# Patient Record
Sex: Male | Born: 1955
Health system: Southern US, Community
[De-identification: ages and names within clinical notes are randomized; demographics above are authoritative.]

## PROBLEM LIST (undated history)

## (undated) DIAGNOSIS — M1990 Unspecified osteoarthritis, unspecified site: Secondary | ICD-10-CM

## (undated) DIAGNOSIS — F4323 Adjustment disorder with mixed anxiety and depressed mood: Secondary | ICD-10-CM

## (undated) DIAGNOSIS — I10 Essential (primary) hypertension: Secondary | ICD-10-CM

## (undated) DIAGNOSIS — J45909 Unspecified asthma, uncomplicated: Secondary | ICD-10-CM

## (undated) DIAGNOSIS — E785 Hyperlipidemia, unspecified: Secondary | ICD-10-CM

## (undated) HISTORY — DX: Unspecified osteoarthritis, unspecified site: M19.90

## (undated) HISTORY — DX: Essential (primary) hypertension: I10

## (undated) HISTORY — DX: Adjustment disorder with mixed anxiety and depressed mood: F43.23

## (undated) HISTORY — DX: Unspecified asthma, uncomplicated: J45.909

## (undated) HISTORY — PX: DENTAL SURGERY: SHX609

## (undated) HISTORY — DX: Hyperlipidemia, unspecified: E78.5

---

## 2006-04-29 ENCOUNTER — Ambulatory Visit: Payer: Self-pay | Admitting: Orthopedic Surgery

## 2006-05-07 ENCOUNTER — Ambulatory Visit (HOSPITAL_COMMUNITY): Admission: RE | Admit: 2006-05-07 | Discharge: 2006-05-07 | Payer: Self-pay | Admitting: Orthopedic Surgery

## 2006-05-13 ENCOUNTER — Ambulatory Visit: Payer: Self-pay | Admitting: Orthopedic Surgery

## 2008-08-21 ENCOUNTER — Ambulatory Visit (HOSPITAL_COMMUNITY): Admission: RE | Admit: 2008-08-21 | Discharge: 2008-08-21 | Payer: Self-pay | Admitting: General Surgery

## 2010-06-06 NOTE — H&P (Signed)
Ryan Cain, Ryan Cain                ACCOUNT NO.:  [id number]   MEDICAL RECORD NO.:  [id number]          PATIENT TYPE:  AMB   LOCATION:  DAY                           FACILITY:  [facility]   PHYSICIAN:  Dalia Heading, M.D.  DATE OF BIRTH:  1955-07-21   DATE OF ADMISSION:  DATE OF DISCHARGE:  LH                              HISTORY & PHYSICAL   CHIEF COMPLAINT:  Family history of colon carcinoma.   HISTORY OF PRESENT ILLNESS:  The patient is a 55 year old black male,  who is referred for endoscopic evaluation.  Needs colonoscopy for  screening purposes.  No abdominal pain, weight loss, nausea, vomiting,  diarrhea, constipation, melena, or hematochezia have been noted.  He has  never had a colonoscopy.  His mother had colon cancer.   PAST MEDICAL HISTORY:  Includes hypertension.   PAST SURGICAL HISTORY:  Unremarkable.   CURRENT MEDICATIONS:  Lipitor, ramipril, Ziac.   ALLERGIES:  No known drug allergies.   REVIEW OF SYSTEMS:  Noncontributory.   PHYSICAL EXAMINATION:  GENERAL:  The patient is a well-developed, well-  nourished, black male, in no acute distress.  LUNGS:  Clear to auscultation with equal breath sounds bilaterally.  HEART:  Regular rate and rhythm without S3, S4, or murmurs.  ABDOMEN:  Soft, nontender, nondistended.  No hepatosplenomegaly or  masses noted.  RECTAL:  Deferred to the procedure.   IMPRESSION:  Family history of colon cancer.   PLAN:  The patient is scheduled for a colonoscopy on August 21, 2008.  The risks and benefits of the procedure including bleeding and  perforation were fully explained to the patient, gave informed consent.      Dalia Heading, M.D.  Electronically Signed     MAJ/MEDQ  D:  07/26/2008  T:  07/27/2008  Job:  [id number]   cc:   Patrica Duel, M.D.  Fax: [phone]

## 2011-12-25 ENCOUNTER — Ambulatory Visit (INDEPENDENT_AMBULATORY_CARE_PROVIDER_SITE_OTHER): Payer: BC Managed Care – PPO | Admitting: Urology

## 2011-12-25 DIAGNOSIS — R3129 Other microscopic hematuria: Secondary | ICD-10-CM

## 2015-08-08 LAB — BASIC METABOLIC PANEL
BUN: 11 mg/dL (ref 4–21)
Creatinine: 1.1 mg/dL (ref <=1.3)
GLUCOSE: 90 mg/dL
Potassium: 4 mmol/L (ref 3.4–5.3)
SODIUM: 143 mmol/L (ref 137–147)

## 2015-08-08 LAB — TSH: TSH: 1.71 u[IU]/mL (ref <=5.90)

## 2015-08-08 LAB — LIPID PANEL
Cholesterol: 210 mg/dL — AB (ref 0–200)
HDL: 53 mg/dL (ref 35–70)
LDL CALC: 131 mg/dL
Triglycerides: 130 mg/dL (ref 40–160)

## 2015-08-08 LAB — CBC AND DIFFERENTIAL
HEMATOCRIT: 38 % — AB (ref 41–53)
HEMOGLOBIN: 13.5 g/dL (ref 13.5–17.5)
Platelets: 202 10*3/uL (ref 150–399)
WBC: 6.1 10*3/mL

## 2015-08-08 LAB — HEPATIC FUNCTION PANEL
ALT: 15 U/L (ref 10–40)
AST: 16 U/L (ref 14–40)
Alkaline Phosphatase: 60 U/L (ref 25–125)
Bilirubin, Total: 0.4 mg/dL

## 2015-11-05 ENCOUNTER — Encounter: Payer: Self-pay | Admitting: Family Medicine

## 2015-11-05 ENCOUNTER — Ambulatory Visit (INDEPENDENT_AMBULATORY_CARE_PROVIDER_SITE_OTHER): Payer: PRIVATE HEALTH INSURANCE | Admitting: Family Medicine

## 2015-11-05 VITALS — BP 136/78 | HR 76 | Temp 98.7°F | Resp 18 | Ht 72.5 in | Wt 267.0 lb

## 2015-11-05 DIAGNOSIS — I10 Essential (primary) hypertension: Secondary | ICD-10-CM | POA: Insufficient documentation

## 2015-11-05 DIAGNOSIS — Z7689 Persons encountering health services in other specified circumstances: Secondary | ICD-10-CM | POA: Diagnosis not present

## 2015-11-05 DIAGNOSIS — E785 Hyperlipidemia, unspecified: Secondary | ICD-10-CM | POA: Diagnosis not present

## 2015-11-05 DIAGNOSIS — E669 Obesity, unspecified: Secondary | ICD-10-CM | POA: Insufficient documentation

## 2015-11-05 DIAGNOSIS — Z23 Encounter for immunization: Secondary | ICD-10-CM | POA: Diagnosis not present

## 2015-11-05 DIAGNOSIS — M170 Bilateral primary osteoarthritis of knee: Secondary | ICD-10-CM | POA: Insufficient documentation

## 2015-11-05 DIAGNOSIS — F4323 Adjustment disorder with mixed anxiety and depressed mood: Secondary | ICD-10-CM

## 2015-11-05 DIAGNOSIS — IMO0001 Reserved for inherently not codable concepts without codable children: Secondary | ICD-10-CM

## 2015-11-05 DIAGNOSIS — E6609 Other obesity due to excess calories: Secondary | ICD-10-CM

## 2015-11-05 DIAGNOSIS — Z6835 Body mass index (BMI) 35.0-35.9, adult: Secondary | ICD-10-CM

## 2015-11-05 MED ORDER — MELOXICAM 15 MG PO TABS: 15.0000 mg | ORAL_TABLET | Freq: Every day | ORAL | 0 refills | Status: DC

## 2015-11-05 NOTE — Patient Instructions (Addendum)
Need old records  Take the meloxicam once a day This is in place of the ibuprofen If you have pain in addition then take an arthritis tylenol  Continue same medicine Call for problems or refills  See me in January for follow up

## 2015-11-05 NOTE — Progress Notes (Signed)
Chief Complaint  Patient presents with  . Establish Care   Here for first visit new to establish. Hypertension many years well controlled per patient with no cardiovascular disease. On lipitor with recent labs drawn by prior PCP.  Will request records. Had colonoscopy at age 60, so is due.   No recent immunizations.  Will do flu and TdaP today and shingles next visit. 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  He has had some anxiety about this and will have a lapse in insurance.  He wishes to have any additional health testing deferred until jan.  He was prescribed xanax.  We discussed that this is a good short term medicine, but NOT long term fix. He has significant arthritis in his knees.  They swell and hurt.  He takes ibuprofen frequently.  Is offered Mobic for daily use instead. He drinks alcohol to excess at times.  He says recently with his unemployment he has had up to a pint a day.  We agree that hw will make a large effort to drink no more than 1-2 drinks daily.    Patient Active Problem List   Diagnosis Date Noted  . Essential hypertension 11/05/2015  . Osteoarthritis of both knees 11/05/2015  . HLD (hyperlipidemia) 11/05/2015  . Situational mixed anxiety and depressive disorder 11/05/2015  . Obesity 11/05/2015    Outpatient Encounter Prescriptions as of 11/05/2015  Medication Sig  . ALPRAZolam (XANAX) 0.5 MG tablet Take 0.5 mg by mouth 2 (two) times daily as needed for anxiety.  Marland Kitchen atorvastatin (LIPITOR) 20 MG tablet Take 20 mg by mouth daily.  . Ginkgo Biloba 120 MG CAPS Take by mouth.  Marland Kitchen ibuprofen (ADVIL,MOTRIN) 200 MG tablet Take 200 mg by mouth every 8 (eight) hours as needed.  Marland Kitchen losartan-hydrochlorothiazide (HYZAAR) 100-25 MG tablet Take 1 tablet by mouth daily.  . Omega-3 Fatty Acids (FISH OIL) 1200 MG CAPS Take 2,400 mg by mouth.  . meloxicam (MOBIC) 15 MG tablet Take 1 tablet (15 mg total) by mouth daily.   No  facility-administered encounter medications on file as of 11/05/2015.     Past Medical History:  Diagnosis Date  . Arthritis   . Asthma    as a child  . Hyperlipidemia   . Hypertension     Past Surgical History:  Procedure Laterality Date  . DENTAL SURGERY      Social History   Social History  . Marital status: Married    Spouse name: Altha Harm  . Number of children: 1  . Years of education: 42   Occupational History  . 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 Smoking status: Never Smoker  . Smokeless tobacco: Never Used  . Alcohol use 3.6 oz/week    6 Shots of liquor per week     Comment: 3x a week bourbon  . Drug use: No  . Sexual activity: Yes    Birth control/ protection: Post-menopausal   Other Topics Concern  . 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 Arthritis Mother   . Hypertension Mother   . Hyperlipidemia Mother   . Heart disease Father 41    heart attack  . Heart disease Paternal Uncle  Review of Systems  Constitutional: Negative for chills, fever and weight loss.  HENT: Negative for congestion and hearing loss.   Eyes: Negative for blurred vision and pain.  Respiratory: Negative for cough and shortness of breath.   Cardiovascular: Negative for chest pain, palpitations and leg swelling.  Gastrointestinal: Negative for abdominal pain, constipation, diarrhea and heartburn.  Genitourinary: Negative for dysuria and frequency.  Musculoskeletal: Positive for joint pain. Negative for falls and myalgias.  Neurological: Negative for dizziness, seizures and headaches.  Psychiatric/Behavioral: Negative for depression. The patient is nervous/anxious. The patient does not have insomnia.     BP 136/78 (BP Location: Right Arm, Patient Position: Sitting, Cuff Size:  Large)   Pulse 76   Temp 98.7 F (37.1 C) (Oral)   Resp 18   Ht 6' 0.5" (1.842 m)   Wt 267 lb (121.1 kg)   SpO2 100%   BMI 35.71 kg/m   Physical Exam  Constitutional: He is oriented to person, place, and time. He appears well-developed and well-nourished.  overweight  HENT:  Head: Normocephalic and atraumatic.  Right Ear: External ear normal.  Left Ear: External ear normal.  Mouth/Throat: Oropharynx is clear and moist.  Eyes: Conjunctivae are normal. Pupils are equal, round, and reactive to light.  Neck: Normal range of motion. Neck supple. No thyromegaly present.  Cardiovascular: Normal rate, regular rhythm and normal heart sounds.   Pulmonary/Chest: Effort normal and breath sounds normal. No respiratory distress.  Abdominal: Soft. Bowel sounds are normal.  Musculoskeletal: Normal range of motion. He exhibits edema.  Both knees with crepitus and warmth, slight effusion  Lymphadenopathy:    He has no cervical adenopathy.  Neurological: He is alert and oriented to person, place, and time.  Gait normal  Skin: Skin is warm and dry.  Psychiatric: He has a normal mood and affect. His behavior is normal. Thought content normal.  Nursing note and vitals reviewed.  ASSESSMENT/PLAN:   1. Essential hypertension Well controlled  2. Primary osteoarthritis of both knees Add mobic.  Will refer ortho when insurance allows  3. Hyperlipidemia, unspecified hyperlipidemia type Get old records  4. Situational mixed anxiety and depressive disorder Xanax prn.  Limit alcohol  5. Need for Tdap vaccination - Tdap vaccine greater than or equal to 7yo IM  6. Need for influenza vaccination - Flu Vaccine QUAD 36+ mos PF IM (Fluarix & Fluzone Quad PF)  7. Class 2 obesity due to excess calories with serious comorbidity and body mass index (BMI) of 35.0 to 35.9 in adult  8. Encounter to establish care with new doctor  Patient Instructions  Need old records  Take the meloxicam once a  day This is in place of the ibuprofen If you have pain in addition then take an arthritis tylenol  Continue same medicine Call for problems or refills  See me in January for follow up    Raylene Everts, MD

## 2015-11-06 ENCOUNTER — Encounter: Payer: Self-pay | Admitting: Family Medicine

## 2016-01-16 ENCOUNTER — Other Ambulatory Visit: Payer: Self-pay | Admitting: Family Medicine

## 2016-01-24 ENCOUNTER — Other Ambulatory Visit: Payer: Self-pay | Admitting: Family Medicine

## 2016-01-24 NOTE — Telephone Encounter (Signed)
Seen 10 17 17

## 2016-02-04 ENCOUNTER — Encounter: Payer: Self-pay | Admitting: Family Medicine

## 2016-02-04 ENCOUNTER — Ambulatory Visit (INDEPENDENT_AMBULATORY_CARE_PROVIDER_SITE_OTHER): Payer: BLUE CROSS/BLUE SHIELD | Admitting: Family Medicine

## 2016-02-04 VITALS — BP 118/78 | HR 72 | Temp 98.4°F | Resp 18 | Ht 72.5 in | Wt 264.0 lb

## 2016-02-04 DIAGNOSIS — M170 Bilateral primary osteoarthritis of knee: Secondary | ICD-10-CM | POA: Diagnosis not present

## 2016-02-04 DIAGNOSIS — I10 Essential (primary) hypertension: Secondary | ICD-10-CM | POA: Diagnosis not present

## 2016-02-04 NOTE — Patient Instructions (Signed)
See me in six months Call sooner for problems 

## 2016-02-04 NOTE — Progress Notes (Signed)
    Chief Complaint  Patient presents with  . Follow-up   No complaints Knees better with NSAID No GERD or heartburn BP well controlled Wants to delay colonoscopy for a few months, starting new job  Patient Active Problem List   Diagnosis Date Noted  . Essential hypertension 11/05/2015  . Osteoarthritis of both knees 11/05/2015  . HLD (hyperlipidemia) 11/05/2015  . Situational mixed anxiety and depressive disorder 11/05/2015  . Obesity 11/05/2015    Outpatient Encounter Prescriptions as of 02/04/2016  Medication Sig  . atorvastatin (LIPITOR) 20 MG tablet Take 20 mg by mouth daily.  . Ginkgo Biloba 120 MG CAPS Take by mouth.  Marland Kitchen ibuprofen (ADVIL,MOTRIN) 200 MG tablet Take 200 mg by mouth every 8 (eight) hours as needed.  Marland Kitchen losartan-hydrochlorothiazide (HYZAAR) 100-25 MG tablet Take 1 tablet by mouth daily.  . meloxicam (MOBIC) 15 MG tablet Take 1 tablet (15 mg total) by mouth daily. TAKE WITH FOOD  . Omega-3 Fatty Acids (FISH OIL) 1200 MG CAPS Take 2,400 mg by mouth.  . sildenafil (VIAGRA) 100 MG tablet Take 100 mg by mouth daily as needed for erectile dysfunction.   No facility-administered encounter medications on file as of 02/04/2016.     No Known Allergies  Review of Systems  Constitutional: Negative for activity change, appetite change and unexpected weight change.  HENT: Negative for congestion.   Eyes: Negative for photophobia and visual disturbance.  Respiratory: Negative for cough and shortness of breath.   Cardiovascular: Negative for chest pain, palpitations and leg swelling.  Gastrointestinal: Negative for abdominal distention and abdominal pain.  Genitourinary: Negative.  Negative for difficulty urinating.  Musculoskeletal: Positive for arthralgias.  Neurological: Negative for dizziness and headaches.  Psychiatric/Behavioral: Negative for dysphoric mood. The patient is not nervous/anxious.     BP 118/78 (BP Location: Right Arm, Patient Position: Sitting,  Cuff Size: Large)   Pulse 72   Temp 98.4 F (36.9 C) (Oral)   Resp 18   Ht 6' 0.5" (1.842 m)   Wt 264 lb (119.7 kg)   SpO2 100%   BMI 35.31 kg/m   Physical Exam  Constitutional: He is oriented to person, place, and time. He appears well-developed and well-nourished.  HENT:  Head: Normocephalic and atraumatic.  Mouth/Throat: Oropharynx is clear and moist.  Eyes: Conjunctivae are normal. Pupils are equal, round, and reactive to light.  Neck: Normal range of motion. Neck supple. No thyromegaly present.  Cardiovascular: Normal rate, regular rhythm and normal heart sounds.   Pulmonary/Chest: Effort normal and breath sounds normal. No respiratory distress.  Musculoskeletal: Normal range of motion. He exhibits no edema.  Lymphadenopathy:    He has no cervical adenopathy.  Neurological: He is alert and oriented to person, place, and time.  Gait normal  Skin: Skin is warm and dry.  Psychiatric: He has a normal mood and affect. His behavior is normal. Thought content normal.  Nursing note and vitals reviewed.   ASSESSMENT/PLAN:  1. Essential hypertension controlled  2. Primary osteoarthritis of both knees improved  Patient Instructions  See me in six months  Call sooner for problems   Raylene Everts, MD

## 2016-02-25 ENCOUNTER — Telehealth: Payer: Self-pay

## 2016-02-25 ENCOUNTER — Encounter: Payer: Self-pay | Admitting: Family Medicine

## 2016-02-25 ENCOUNTER — Ambulatory Visit (HOSPITAL_COMMUNITY)
Admission: RE | Admit: 2016-02-25 | Discharge: 2016-02-25 | Disposition: A | Discharge status: Home / Self Care | Payer: BLUE CROSS/BLUE SHIELD | Source: Ambulatory Visit | Attending: Family Medicine | Admitting: Family Medicine

## 2016-02-25 ENCOUNTER — Ambulatory Visit (INDEPENDENT_AMBULATORY_CARE_PROVIDER_SITE_OTHER): Payer: BLUE CROSS/BLUE SHIELD | Admitting: Family Medicine

## 2016-02-25 VITALS — BP 118/74 | HR 80 | Temp 97.5°F | Resp 18 | Ht 72.5 in | Wt 265.0 lb

## 2016-02-25 DIAGNOSIS — M5431 Sciatica, right side: Secondary | ICD-10-CM | POA: Diagnosis not present

## 2016-02-25 DIAGNOSIS — M2578 Osteophyte, vertebrae: Secondary | ICD-10-CM | POA: Insufficient documentation

## 2016-02-25 DIAGNOSIS — I700 Atherosclerosis of aorta: Secondary | ICD-10-CM | POA: Insufficient documentation

## 2016-02-25 DIAGNOSIS — M5136 Other intervertebral disc degeneration, lumbar region: Secondary | ICD-10-CM | POA: Diagnosis not present

## 2016-02-25 MED ORDER — GABAPENTIN 100 MG PO CAPS: 100.0000 mg | ORAL_CAPSULE | Freq: Three times a day (TID) | ORAL | 3 refills | Status: DC

## 2016-02-25 NOTE — Telephone Encounter (Signed)
Left message with pts wife to call office.

## 2016-02-25 NOTE — Telephone Encounter (Signed)
-----   Message from Raylene Everts, MD sent at 02/25/2016  1:46 PM EST ----- Call Mr Alzate.  He works second shift.  His x ray shows some arthritis and bone spurs, nothing seroius.  No specific problem at his lower back/hip bone area on the left.

## 2016-02-25 NOTE — Patient Instructions (Signed)
Take the meloxicam daily Take the gabapentin for the nerve pain Start with one pill at bedtime May take daytime if does not cause drowsiness  Get the back x ray We will call you with the result  See me if not better in a couple of weeks Call if you desire physical therapy.  I think it would help

## 2016-02-25 NOTE — Progress Notes (Signed)
Chief Complaint  Patient presents with  . Back Pain   Here for back pain Says he has had back pain intermittently for years Currently has pain in the right low back and it radiates down back of leg to knee He is on a new job and has worked 12 days in a row. He stands for almost all of his shift, and he feels more pain and fatigue as the shift wears on.  No numbness or weakness.  No fall or injury.  No illness or fever.  No diagnosis of malignancy. The pain is there every day.  It is a dull ache most of the time but some of the leg pain is severe.   He has never had back x rays or specialist evaluation.   Patient Active Problem List   Diagnosis Date Noted  . Essential hypertension 11/05/2015  . Osteoarthritis of both knees 11/05/2015  . HLD (hyperlipidemia) 11/05/2015  . Situational mixed anxiety and depressive disorder 11/05/2015  . Obesity 11/05/2015    Outpatient Encounter Prescriptions as of 02/25/2016  Medication Sig  . atorvastatin (LIPITOR) 20 MG tablet Take 20 mg by mouth daily.  . Ginkgo Biloba 120 MG CAPS Take by mouth.  Marland Kitchen ibuprofen (ADVIL,MOTRIN) 200 MG tablet Take 200 mg by mouth every 8 (eight) hours as needed.  Marland Kitchen losartan-hydrochlorothiazide (HYZAAR) 100-25 MG tablet Take 1 tablet by mouth daily.  . meloxicam (MOBIC) 15 MG tablet Take 1 tablet (15 mg total) by mouth daily. TAKE WITH FOOD  . Omega-3 Fatty Acids (FISH OIL) 1200 MG CAPS Take 2,400 mg by mouth.  . sildenafil (VIAGRA) 100 MG tablet Take 100 mg by mouth daily as needed for erectile dysfunction.  . gabapentin (NEURONTIN) 100 MG capsule Take 1 capsule (100 mg total) by mouth 3 (three) times daily.   No facility-administered encounter medications on file as of 02/25/2016.     No Known Allergies  Review of Systems  Constitutional: Negative for chills and fever.  HENT: Negative.  Negative for congestion.   Eyes: Negative.  Negative for visual disturbance.  Respiratory: Negative.  Negative for shortness of  breath and wheezing.   Cardiovascular: Negative for chest pain, palpitations and leg swelling.  Genitourinary: Negative.  Negative for difficulty urinating and frequency.  Musculoskeletal: Positive for arthralgias and back pain.  Neurological: Negative for weakness and numbness.  Psychiatric/Behavioral: Negative for dysphoric mood and sleep disturbance.    BP 118/74 (BP Location: Right Arm, Patient Position: Sitting, Cuff Size: Large)   Pulse 80   Temp 97.5 F (36.4 C) (Temporal)   Resp 18   Ht 6' 0.5" (1.842 m)   Wt 265 lb 0.6 oz (120.2 kg)   SpO2 99%   BMI 35.45 kg/m   Physical Exam  Constitutional: He is oriented to person, place, and time. He appears well-developed and well-nourished.  No distress.  Overweight.  Mild antalgia to gait  HENT:  Head: Normocephalic and atraumatic.  Nose: Nose normal.  Eyes: Conjunctivae are normal. Pupils are equal, round, and reactive to light.  Neck: Normal range of motion.  Musculoskeletal:  Lumbar spine is straight and symmetric. Full range of motion. No  muscle spasm. Strength, sensation, range of motion, and reflexes are normal in both lower extremities. Straight leg raise is negative bilateral. Mild tenderness to deep palpation of the right SI area.   Lymphadenopathy:    He has no cervical adenopathy.  Neurological: He is alert and oriented to person, place, and time. He displays  normal reflexes.  Skin: No rash noted.  Psychiatric: He has a normal mood and affect. His behavior is normal. Thought content normal.    ASSESSMENT/PLAN:  1. Right sided sciatica  - DG Lumbar Spine Complete; Future   Patient Instructions  Take the meloxicam daily Take the gabapentin for the nerve pain Start with one pill at bedtime May take daytime if does not cause drowsiness  Get the back x ray We will call you with the result  See me if not better in a couple of weeks Call if you desire physical therapy.  I think it would help    Raylene Everts, MD

## 2016-02-26 ENCOUNTER — Telehealth: Payer: Self-pay

## 2016-02-26 MED ORDER — GABAPENTIN 100 MG PO CAPS: 100.0000 mg | ORAL_CAPSULE | Freq: Three times a day (TID) | ORAL | 3 refills | Status: DC

## 2016-02-26 NOTE — Telephone Encounter (Signed)
Pt returned call, aware of x ray results

## 2016-02-26 NOTE — Telephone Encounter (Signed)
-----   Message from Raylene Everts, MD sent at 02/25/2016  1:46 PM EST ----- Call Mr Hoshaw.  He works second shift.  His x ray shows some arthritis and bone spurs, nothing seroius.  No specific problem at his lower back/hip bone area on the left.

## 2016-02-26 NOTE — Telephone Encounter (Signed)
-----   Message from Raylene Everts, MD sent at 02/25/2016  1:46 PM EST ----- Call Mr Skubal.  He works second shift.  His x ray shows some arthritis and bone spurs, nothing seroius.  No specific problem at his lower back/hip bone area on the left.

## 2016-03-10 ENCOUNTER — Telehealth: Payer: Self-pay

## 2016-03-10 ENCOUNTER — Other Ambulatory Visit: Payer: Self-pay

## 2016-03-10 NOTE — Telephone Encounter (Signed)
Pt called stating he has increased his gabapentin from 100 to 200 mg tid d/t pain at work. States he is tolerating this well, just wanted to up date you.

## 2016-03-10 NOTE — Telephone Encounter (Signed)
noted 

## 2016-03-16 ENCOUNTER — Telehealth: Payer: Self-pay

## 2016-03-16 NOTE — Telephone Encounter (Signed)
States pain meds prescribed once daily but due to increased pain he has been taking them twice daily and is almost out. Wants a call back

## 2016-03-16 NOTE — Telephone Encounter (Signed)
Called Ryan Cain, he is not at home, left message to call me in the am.

## 2016-03-17 ENCOUNTER — Telehealth: Payer: Self-pay

## 2016-03-17 NOTE — Telephone Encounter (Signed)
Can increase gabapentin to 300 mg tid Can try different NSAID (naproxen 500 BID) Can add muscle relaxer at night (tizanidine 4 mg) Can add PT Can refer to ortho Will NOT refill alprazolam.  It is habit forming.

## 2016-03-18 NOTE — Telephone Encounter (Signed)
Spoke to pt this am, given advice, pt asking about xanax, informed you are not willing to refill; pt states he will call old pcp for a refill.

## 2016-03-18 NOTE — Telephone Encounter (Signed)
Will discuss with him at his next visit.

## 2016-03-19 MED ORDER — GABAPENTIN 300 MG PO CAPS: 300.0000 mg | ORAL_CAPSULE | Freq: Three times a day (TID) | ORAL | 3 refills | Status: DC

## 2016-07-31 ENCOUNTER — Encounter: Payer: Self-pay | Admitting: Family Medicine

## 2016-07-31 ENCOUNTER — Ambulatory Visit (INDEPENDENT_AMBULATORY_CARE_PROVIDER_SITE_OTHER): Payer: BLUE CROSS/BLUE SHIELD | Admitting: Family Medicine

## 2016-07-31 VITALS — BP 110/72 | HR 60 | Temp 96.4°F | Resp 16 | Ht 73.0 in | Wt 252.0 lb

## 2016-07-31 DIAGNOSIS — E785 Hyperlipidemia, unspecified: Secondary | ICD-10-CM | POA: Diagnosis not present

## 2016-07-31 DIAGNOSIS — I10 Essential (primary) hypertension: Secondary | ICD-10-CM

## 2016-07-31 DIAGNOSIS — F4323 Adjustment disorder with mixed anxiety and depressed mood: Secondary | ICD-10-CM

## 2016-07-31 MED ORDER — DULOXETINE HCL 30 MG PO CPEP: 30.0000 mg | ORAL_CAPSULE | Freq: Every day | ORAL | 3 refills | Status: DC

## 2016-07-31 NOTE — Patient Instructions (Addendum)
Take the duloxetine once a day in the morning See me in 30 days Call if you have side effects or problems ( none expected)  Due for lab tests  Continue  Same BP medicine, cholesterol and back medicine

## 2016-07-31 NOTE — Progress Notes (Signed)
Chief Complaint  Patient presents with  . Follow-up    6 month  still taking mobic and neurontin for back pain with improvement overall, preserved function and ability to work.  Symptoms off and on. BP well controlled Due for lipid testing Has lost 15 lbs Is not happy with new job, irritable.  Discussed.  Will treat with duloxetine. 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 Due for colo, but wants to delay  Patient Active Problem List   Diagnosis Date Noted  . Atherosclerosis of abdominal aorta ([place]) 02/25/2016  . Essential hypertension 11/05/2015  . Osteoarthritis of both knees 11/05/2015  . HLD (hyperlipidemia) 11/05/2015  . Obesity 11/05/2015    Outpatient Encounter Prescriptions as of 07/31/2016  Medication Sig  . atorvastatin (LIPITOR) 20 MG tablet Take 20 mg by mouth daily.  Marland Kitchen gabapentin (NEURONTIN) 100 MG capsule Take 1 capsule (100 mg total) by mouth 3 (three) times daily.  Marland Kitchen gabapentin (NEURONTIN) 300 MG capsule Take 1 capsule (300 mg total) by mouth 3 (three) times daily.  Marland Kitchen ibuprofen (ADVIL,MOTRIN) 200 MG tablet Take 200 mg by mouth every 8 (eight) hours as needed.  Marland Kitchen losartan-hydrochlorothiazide (HYZAAR) 100-25 MG tablet Take 1 tablet by mouth daily.  . meloxicam (MOBIC) 15 MG tablet Take 1 tablet (15 mg total) by mouth daily. TAKE WITH FOOD  . Omega-3 Fatty Acids (FISH OIL) 1200 MG CAPS Take 2,400 mg by mouth.  . DULoxetine (CYMBALTA) 30 MG capsule Take 1 capsule (30 mg total) by mouth daily.   No facility-administered encounter medications on file as of 07/31/2016.     No Known Allergies  Review of Systems  Constitutional: Negative for chills and fever.  HENT: Negative.  Negative for congestion.   Eyes: Negative.  Negative for visual disturbance.  Respiratory: Negative.  Negative for shortness of breath and wheezing.   Cardiovascular: Negative for chest pain, palpitations and leg swelling.    Genitourinary: Negative.  Negative for difficulty urinating and frequency.  Musculoskeletal: Positive for back pain. Negative for arthralgias.  Neurological: Negative for weakness and numbness.  Psychiatric/Behavioral: Positive for dysphoric mood and sleep disturbance. The patient is nervous/anxious.     BP 110/72 (BP Location: Left Arm, Patient Position: Sitting, Cuff Size: Large)   Pulse 60   Temp (!) 96.4 F (35.8 C) (Temporal)   Resp 16   Ht 6\' 1"  (1.854 m)   Wt 252 lb (114.3 kg)   SpO2 97%   BMI 33.25 kg/m   Physical Exam  Constitutional: He is oriented to person, place, and time. He appears well-developed and well-nourished.  HENT:  Head: Normocephalic and atraumatic.  Mouth/Throat: Oropharynx is clear and moist.  Eyes: Pupils are equal, round, and reactive to light. Conjunctivae are normal.  Neck: Normal range of motion. Neck supple. No thyromegaly present.  Cardiovascular: Normal rate, regular rhythm and normal heart sounds.   Pulmonary/Chest: Effort normal and breath sounds normal. No respiratory distress.  Abdominal: Soft. Bowel sounds are normal.  Musculoskeletal: Normal range of motion. He exhibits no edema.  Lymphadenopathy:    He has no cervical adenopathy.  Neurological: He is alert and oriented to person, place, and time.  Gait normal  Skin: Skin is warm and dry.  Psychiatric: He has a normal mood and affect. His behavior is normal. Thought content normal.  Nursing note and vitals reviewed.   ASSESSMENT/PLAN:  1. Essential hypertension  - CBC - COMPLETE METABOLIC PANEL  WITH GFR - Lipid panel - VITAMIN D 25 Hydroxy (Vit-D Deficiency, Fractures) - Urinalysis, Routine w reflex microscopic  2. Situational mixed anxiety and depressive disorder Discussed new medicine.  Side effects.  Expected results.  3. Hyperlipidemia, unspecified hyperlipidemia type    Patient Instructions  Take the duloxetine once a day in the morning See me in 30 days Call if  you have side effects or problems ( none expected)  Due for lab tests  Continue  Same BP medicine, cholesterol and back medicine    Raylene Everts, MD

## 2016-08-03 ENCOUNTER — Ambulatory Visit: Payer: BLUE CROSS/BLUE SHIELD | Admitting: Family Medicine

## 2016-09-01 ENCOUNTER — Ambulatory Visit: Payer: BLUE CROSS/BLUE SHIELD | Admitting: Family Medicine

## 2017-03-29 ENCOUNTER — Encounter: Payer: Self-pay | Admitting: Family Medicine

## 2017-08-11 DIAGNOSIS — R5383 Other fatigue: Secondary | ICD-10-CM | POA: Diagnosis not present

## 2017-08-11 DIAGNOSIS — E559 Vitamin D deficiency, unspecified: Secondary | ICD-10-CM | POA: Diagnosis not present

## 2017-08-11 DIAGNOSIS — E785 Hyperlipidemia, unspecified: Secondary | ICD-10-CM | POA: Diagnosis not present

## 2017-08-11 DIAGNOSIS — R6882 Decreased libido: Secondary | ICD-10-CM | POA: Diagnosis not present

## 2017-08-11 DIAGNOSIS — E669 Obesity, unspecified: Secondary | ICD-10-CM | POA: Diagnosis not present

## 2017-08-11 DIAGNOSIS — Z0000 Encounter for general adult medical examination without abnormal findings: Secondary | ICD-10-CM | POA: Diagnosis not present

## 2017-08-11 DIAGNOSIS — I10 Essential (primary) hypertension: Secondary | ICD-10-CM | POA: Diagnosis not present

## 2017-08-11 DIAGNOSIS — Z125 Encounter for screening for malignant neoplasm of prostate: Secondary | ICD-10-CM | POA: Diagnosis not present

## 2017-08-23 DIAGNOSIS — E559 Vitamin D deficiency, unspecified: Secondary | ICD-10-CM | POA: Diagnosis not present

## 2017-08-23 DIAGNOSIS — E669 Obesity, unspecified: Secondary | ICD-10-CM | POA: Diagnosis not present

## 2017-08-23 DIAGNOSIS — I10 Essential (primary) hypertension: Secondary | ICD-10-CM | POA: Diagnosis not present

## 2017-08-23 DIAGNOSIS — R6882 Decreased libido: Secondary | ICD-10-CM | POA: Diagnosis not present

## 2017-11-03 DIAGNOSIS — E559 Vitamin D deficiency, unspecified: Secondary | ICD-10-CM | POA: Diagnosis not present

## 2017-11-03 DIAGNOSIS — R5383 Other fatigue: Secondary | ICD-10-CM | POA: Diagnosis not present

## 2017-11-03 DIAGNOSIS — E669 Obesity, unspecified: Secondary | ICD-10-CM | POA: Diagnosis not present

## 2017-11-03 DIAGNOSIS — I10 Essential (primary) hypertension: Secondary | ICD-10-CM | POA: Diagnosis not present

## 2017-12-06 DIAGNOSIS — R0789 Other chest pain: Secondary | ICD-10-CM | POA: Diagnosis not present

## 2018-04-14 ENCOUNTER — Encounter (INDEPENDENT_AMBULATORY_CARE_PROVIDER_SITE_OTHER): Payer: Self-pay | Admitting: Internal Medicine

## 2019-11-14 ENCOUNTER — Encounter (INDEPENDENT_AMBULATORY_CARE_PROVIDER_SITE_OTHER): Payer: Self-pay | Admitting: Internal Medicine

## 2019-11-14 ENCOUNTER — Ambulatory Visit (INDEPENDENT_AMBULATORY_CARE_PROVIDER_SITE_OTHER): Payer: 59 | Admitting: Internal Medicine

## 2019-11-14 ENCOUNTER — Other Ambulatory Visit: Payer: Self-pay

## 2019-11-14 VITALS — BP 180/100 | HR 72 | Temp 97.7°F | Ht 69.0 in | Wt 234.8 lb

## 2019-11-14 DIAGNOSIS — Z6835 Body mass index (BMI) 35.0-35.9, adult: Secondary | ICD-10-CM

## 2019-11-14 DIAGNOSIS — I10 Essential (primary) hypertension: Secondary | ICD-10-CM

## 2019-11-14 DIAGNOSIS — Z125 Encounter for screening for malignant neoplasm of prostate: Secondary | ICD-10-CM

## 2019-11-14 DIAGNOSIS — Z23 Encounter for immunization: Secondary | ICD-10-CM | POA: Diagnosis not present

## 2019-11-14 DIAGNOSIS — Z131 Encounter for screening for diabetes mellitus: Secondary | ICD-10-CM

## 2019-11-14 DIAGNOSIS — Z13220 Encounter for screening for lipoid disorders: Secondary | ICD-10-CM

## 2019-11-14 DIAGNOSIS — F419 Anxiety disorder, unspecified: Secondary | ICD-10-CM

## 2019-11-14 DIAGNOSIS — E559 Vitamin D deficiency, unspecified: Secondary | ICD-10-CM | POA: Diagnosis not present

## 2019-11-14 LAB — T3, FREE: T3, Free: 2.8 pg/mL (ref 2.3–4.2)

## 2019-11-14 LAB — TSH: TSH: 1.6 mIU/L (ref 0.40–4.50)

## 2019-11-14 LAB — CBC
HCT: 41.3 % (ref 38.5–50.0)
Platelets: 203 10*3/uL (ref 140–400)

## 2019-11-14 LAB — VITAMIN D 25 HYDROXY (VIT D DEFICIENCY, FRACTURES): Vit D, 25-Hydroxy: 52 ng/mL (ref 30–100)

## 2019-11-14 MED ORDER — DULOXETINE HCL 30 MG PO CPEP: 30.0000 mg | ORAL_CAPSULE | Freq: Every day | ORAL | 3 refills | Status: DC

## 2019-11-14 MED ORDER — LOSARTAN POTASSIUM 50 MG PO TABS: 50.0000 mg | ORAL_TABLET | Freq: Every day | ORAL | 3 refills | Status: DC

## 2019-11-14 NOTE — Progress Notes (Signed)
Metrics: Intervention Frequency ACO  Documented Smoking Status Yearly  Screened one or more times in 24 months  Cessation Counseling or  Active cessation medication Past 24 months  Past 24 months   Guideline developer: UpToDate (See UpToDate for funding source) Date Released: 2014       Wellness Office Visit  Subjective:  Patient ID: Ryan Cain, male    DOB: 01-Sep-1955  Age: 64 y.o. MRN: [id number]  CC: This man comes in after a very long hiatus for follow-up.  I have not seen him since January 2020. HPI  He decided to discontinue his losartan/HCTZ in January of this year and also stopped his Cymbalta.  He said that this was due to multiple list of factors relating to his home as well as work life.  He apparently took 50% salary reduction recently. Past Medical History:  Diagnosis Date  . Arthritis   . Asthma    as a child  . Hyperlipidemia   . Hypertension   . Situational mixed anxiety and depressive disorder   . Situational mixed anxiety and depressive disorder    Past Surgical History:  Procedure Laterality Date  . DENTAL SURGERY       Family History  Problem Relation Age of Onset  . Arthritis Mother   . Hypertension Mother   . Hyperlipidemia Mother   . Heart disease Father 48       heart attack  . Heart disease Paternal Uncle     Social History   Social History Narrative   Lives with wife Altha Harm who has spina bifida- affected mildly.Married over 40 years.   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   Social History   Tobacco Use  . Smoking status: Never Smoker  . Smokeless tobacco: Never Used  Substance Use Topics  . Alcohol use: Yes    Alcohol/week: 6.0 standard drinks    Types: 6 Shots of liquor per week    Comment: 3x a week bourbon    Current Meds  Medication Sig  . Cholecalciferol (VITAMIN D3) 250 MCG (10000 UT) capsule Take 10,000 Units by mouth daily.  Marland Kitchen ibuprofen (ADVIL,MOTRIN) 200 MG tablet Take 200 mg by mouth every 8  (eight) hours as needed.  . Omega-3 Fatty Acids (FISH OIL) 1200 MG CAPS Take 2,400 mg by mouth.      Depression screen [facility] 2/9 11/14/2019 07/31/2016 02/04/2016 11/05/2015  Decreased Interest 0 0 0 0  Down, Depressed, Hopeless 1 0 0 0  PHQ - 2 Score 1 0 0 0  Altered sleeping 1 - - -  Tired, decreased energy 1 - - -  Change in appetite 1 - - -  Feeling bad or failure about yourself  0 - - -  Trouble concentrating 0 - - -  Moving slowly or fidgety/restless 0 - - -  Suicidal thoughts 1 - - -  PHQ-9 Score 5 - - -  Difficult doing work/chores Not difficult at all - - -     Objective:   Today's Vitals: BP (!) 180/100   Pulse 72   Temp 97.7 F (36.5 C) (Temporal)   Ht 5\' 9"  (1.753 m)   Wt 234 lb 12.8 oz (106.5 kg)   SpO2 98%   BMI 34.67 kg/m  Vitals with BMI 11/14/2019 07/31/2016 02/25/2016  Height 5\' 9"  6\' 1"  6' 0.5"  Weight 234 lbs 13 oz 252 lbs 265 lbs 1 oz  BMI 34.66 35.0 09.3  Systolic 818 299 371  Diastolic  100 72 74  Pulse 72 60 80     Physical Exam  Blood pressure is uncontrolled.  He is alert and orientated without any focal neurological signs.     Assessment   1. Essential hypertension   2. Class 2 severe obesity due to excess calories with serious comorbidity and body mass index (BMI) of 35.0 to 35.9 in adult ([place])   3. Vitamin D deficiency disease   4. Special screening for malignant neoplasm of prostate   5. Screening for diabetes mellitus   6. Screening for lipoid disorders   7. Anxiety       Tests ordered Orders Placed This Encounter  Procedures  . CBC  . COMPLETE METABOLIC PANEL WITH GFR  . Hemoglobin A1c  . Lipid panel  . PSA, Total with Reflex to PSA, Free  . T3, free  . T4  . TSH  . VITAMIN D 25 Hydroxy (Vit-D Deficiency, Fractures)     Plan: 1. I am going to start him back on losartan 50 mg daily for his hypertension which is uncontrolled. 2. I will start him back on Cymbalta 30 mg daily for his anxiety. 3. Blood work is  ordered. 4. Follow-up in 6 weeks or so for an annual physical exam.   Meds ordered this encounter  Medications  . DULoxetine (CYMBALTA) 30 MG capsule    Sig: Take 1 capsule (30 mg total) by mouth daily.    Dispense:  30 capsule    Refill:  3  . losartan (COZAAR) 50 MG tablet    Sig: Take 1 tablet (50 mg total) by mouth daily.    Dispense:  30 tablet    Refill:  3    Jernard Reiber Luther Parody, MD

## 2019-11-14 NOTE — Progress Notes (Signed)
Error

## 2019-11-15 LAB — CBC
Hemoglobin: 14.5 g/dL (ref 13.2–17.1)
MCH: 33.6 pg — ABNORMAL HIGH (ref 27.0–33.0)
MCHC: 35.1 g/dL (ref 32.0–36.0)
MCV: 95.8 fL (ref 80.0–100.0)
MPV: 11.3 fL (ref 7.5–12.5)
RBC: 4.31 10*6/uL (ref 4.20–5.80)
RDW: 12.8 % (ref 11.0–15.0)
WBC: 5.4 10*3/uL (ref 3.8–10.8)

## 2019-11-15 LAB — LIPID PANEL
Cholesterol: 225 mg/dL — ABNORMAL HIGH (ref <200)
HDL: 67 mg/dL (ref >=40)
LDL Cholesterol (Calc): 142 mg/dL (calc) — ABNORMAL HIGH
Non-HDL Cholesterol (Calc): 158 mg/dL (calc) — ABNORMAL HIGH (ref <130)
Total CHOL/HDL Ratio: 3.4 (calc) (ref <5.0)
Triglycerides: 70 mg/dL (ref <150)

## 2019-11-15 LAB — COMPLETE METABOLIC PANEL WITH GFR
AG Ratio: 1.7 (calc) (ref 1.0–2.5)
ALT: 12 U/L (ref 9–46)
AST: 18 U/L (ref 10–35)
Albumin: 4.2 g/dL (ref 3.6–5.1)
Alkaline phosphatase (APISO): 55 U/L (ref 35–144)
BUN: 11 mg/dL (ref 7–25)
CO2: 27 mmol/L (ref 20–32)
Calcium: 9.9 mg/dL (ref 8.6–10.3)
Chloride: 107 mmol/L (ref 98–110)
Creat: 1.07 mg/dL (ref 0.70–1.25)
GFR, Est African American: 85 mL/min/{1.73_m2} (ref >=60)
GFR, Est Non African American: 73 mL/min/{1.73_m2} (ref >=60)
Globulin: 2.5 g/dL (calc) (ref 1.9–3.7)
Glucose, Bld: 82 mg/dL (ref 65–99)
Potassium: 4.5 mmol/L (ref 3.5–5.3)
Sodium: 143 mmol/L (ref 135–146)
Total Bilirubin: 0.7 mg/dL (ref 0.2–1.2)
Total Protein: 6.7 g/dL (ref 6.1–8.1)

## 2019-11-15 LAB — HEMOGLOBIN A1C
Hgb A1c MFr Bld: 5 % of total Hgb (ref <5.7)
Mean Plasma Glucose: 97 (calc)
eAG (mmol/L): 5.4 (calc)

## 2019-11-15 LAB — PSA, TOTAL WITH REFLEX TO PSA, FREE: PSA, Total: 2.9 ng/mL (ref <=4.0)

## 2019-11-15 LAB — T4: T4, Total: 5.4 ug/dL (ref 4.9–10.5)

## 2019-12-26 ENCOUNTER — Ambulatory Visit (INDEPENDENT_AMBULATORY_CARE_PROVIDER_SITE_OTHER): Payer: 59 | Admitting: Internal Medicine

## 2019-12-26 ENCOUNTER — Other Ambulatory Visit: Payer: Self-pay

## 2019-12-26 ENCOUNTER — Encounter (INDEPENDENT_AMBULATORY_CARE_PROVIDER_SITE_OTHER): Payer: Self-pay | Admitting: Internal Medicine

## 2019-12-26 VITALS — BP 140/76 | HR 66 | Temp 97.6°F | Resp 18 | Ht 69.0 in | Wt 236.0 lb

## 2019-12-26 DIAGNOSIS — Z1211 Encounter for screening for malignant neoplasm of colon: Secondary | ICD-10-CM

## 2019-12-26 DIAGNOSIS — Z0001 Encounter for general adult medical examination with abnormal findings: Secondary | ICD-10-CM | POA: Diagnosis not present

## 2019-12-26 DIAGNOSIS — E669 Obesity, unspecified: Secondary | ICD-10-CM

## 2019-12-26 DIAGNOSIS — N401 Enlarged prostate with lower urinary tract symptoms: Secondary | ICD-10-CM

## 2019-12-26 DIAGNOSIS — I10 Essential (primary) hypertension: Secondary | ICD-10-CM | POA: Diagnosis not present

## 2019-12-26 DIAGNOSIS — R350 Frequency of micturition: Secondary | ICD-10-CM

## 2019-12-26 DIAGNOSIS — Z1159 Encounter for screening for other viral diseases: Secondary | ICD-10-CM | POA: Diagnosis not present

## 2019-12-26 NOTE — Progress Notes (Signed)
Chief Complaint: This 64 year old man comes in for an annual physical exam. HPI: I had seen him about 6 to 8 weeks ago after long hiatus.  He had discontinued several medications.  I restarted him on losartan and Cymbalta.  Blood work is also done at the time and I reviewed all these blood tests with him.  His total cholesterol is elevated but HDL levels are good.  He does not have a history of coronary artery disease or cerebrovascular disease.  He is not diabetic. He does have some symptoms of BPH.  His PSA was normal.  The symptoms of BPH are not bothersome to him. He has mild erectile dysfunction but nothing that is bothering him too much.  Past Medical History:  Diagnosis Date  . Arthritis   . Asthma    as a child  . Hyperlipidemia   . Hypertension   . Situational mixed anxiety and depressive disorder   . Situational mixed anxiety and depressive disorder    Past Surgical History:  Procedure Laterality Date  . DENTAL SURGERY       Social History   Social History Narrative   Lives with wife Altha Harm who has spina bifida- affected mildly.Married over 40 years.   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    Social History   Tobacco Use  . Smoking status: Never Smoker  . Smokeless tobacco: Never Used  Substance Use Topics  . Alcohol use: Yes    Alcohol/week: 6.0 standard drinks    Types: 6 Shots of liquor per week    Comment: 3x a week bourbon      Allergies: No Known Allergies   Current Meds  Medication Sig  . Cholecalciferol (VITAMIN D3) 250 MCG (10000 UT) capsule Take 10,000 Units by mouth daily.  . DULoxetine (CYMBALTA) 30 MG capsule Take 1 capsule (30 mg total) by mouth daily.  Marland Kitchen ibuprofen (ADVIL,MOTRIN) 200 MG tablet Take 200 mg by mouth every 8 (eight) hours as needed.  Marland Kitchen losartan (COZAAR) 50 MG tablet Take 1 tablet (50 mg total) by mouth daily.  . NON FORMULARY Take 1 Dose by mouth in the morning. Drink Beet Supplement in AM.  . Omega-3  Fatty Acids (FISH OIL) 1200 MG CAPS Take 2,400 mg by mouth.  . [DISCONTINUED] atorvastatin (LIPITOR) 20 MG tablet Take 20 mg by mouth daily.   . [DISCONTINUED] DULoxetine (CYMBALTA) 30 MG capsule Take 1 capsule (30 mg total) by mouth daily.  . [DISCONTINUED] gabapentin (NEURONTIN) 100 MG capsule Take 1 capsule (100 mg total) by mouth 3 (three) times daily.  . [DISCONTINUED] gabapentin (NEURONTIN) 300 MG capsule Take 1 capsule (300 mg total) by mouth 3 (three) times daily.  . [DISCONTINUED] meloxicam (MOBIC) 15 MG tablet Take 1 tablet (15 mg total) by mouth daily. TAKE WITH FOOD       Depression screen [facility] 2/9 11/14/2019 07/31/2016 02/04/2016 11/05/2015  Decreased Interest 0 0 0 0  Down, Depressed, Hopeless 1 0 0 0  PHQ - 2 Score 1 0 0 0  Altered sleeping 1 - - -  Tired, decreased energy 1 - - -  Change in appetite 1 - - -  Feeling bad or failure about yourself  0 - - -  Trouble concentrating 0 - - -  Moving slowly or fidgety/restless 0 - - -  Suicidal thoughts 1 - - -  PHQ-9 Score 5 - - -  Difficult doing work/chores Not difficult at all - - -  [id number] from the symptoms mentioned above,there are no other symptoms referable to all systems reviewed.       Physical Exam: Blood pressure 140/76, pulse 66, temperature 97.6 F (36.4 C), temperature source Temporal, resp. rate 18, height 5\' 9"  (1.753 m), weight 236 lb (107 kg), SpO2 97 %. Vitals with BMI 12/26/2019 11/14/2019 11/14/2019  Height 5\' 9"  (No Data) 5\' 9"   Weight 236 lbs (No Data) 234 lbs 13 oz  BMI 38.88 - 28.00  Systolic 349 (No Data) 179  Diastolic 76 (No Data) 150  Pulse 66 - 72      He looks systemically well.  He is obese.  Blood pressure thankfully is much better than it was when I saw him last time. General: Alert, cooperative, and appears to be the stated age.No pallor.  No jaundice.  No clubbing. Head: Normocephalic Eyes: Sclera white, pupils equal and reactive to light, red reflex x 2,  Ears: Normal  bilaterally Oral cavity: Lips, mucosa, and tongue normal: Teeth and gums normal Neck: No adenopathy, supple, symmetrical, trachea midline, and thyroid does not appear enlarged Respiratory: Clear to auscultation bilaterally.No wheezing, crackles or bronchial breathing. Cardiovascular: Heart sounds are present and appear to be normal without murmurs or added sounds.  No carotid bruits.  Peripheral pulses are present and equal bilaterally.: Gastrointestinal:positive bowel sounds, no hepatosplenomegaly.  No masses felt.No tenderness. Skin: Clear, No rashes noted.No worrisome skin lesions seen. Neurological: Grossly intact without focal findings, cranial nerves II through XII intact, muscle strength equal bilaterally Musculoskeletal: No acute joint abnormalities noted.Full range of movement noted with joints. Psychiatric: Affect appropriate, non-anxious.    Assessment  1. Encounter for general adult medical examination with abnormal findings   2. Encounter for hepatitis C screening test for low risk patient   3. Colon cancer screening   4. Essential hypertension   5. Obesity (BMI 30-39.9)   6. Benign prostatic hyperplasia with urinary frequency     Tests Ordered:   Orders Placed This Encounter  Procedures  . Hepatitis C antibody  . COMPLETE METABOLIC PANEL WITH GFR  . Ambulatory referral to Gastroenterology     Plan  75. 64 year old man with hypertension and obesity. 2. We will arrange screening colonoscopy as he is overdue. 3. He will continue with all medications above.  I encouraged him to discontinue drinking Coca-Cola every day which he tends to do and this in itself will help him lose weight I believe.  A full discussion can be on a future appointment. 4. Follow-up with Judson Roch in about 3 months.  Further recommendations will depend on blood results.     No orders of the defined types were placed in this encounter.    Durrel Mcnee C Cervando Durnin   12/26/2019, 4:18 PM

## 2019-12-27 LAB — COMPLETE METABOLIC PANEL WITH GFR
AG Ratio: 2 (calc) (ref 1.0–2.5)
ALT: 14 U/L (ref 9–46)
AST: 20 U/L (ref 10–35)
Albumin: 4.3 g/dL (ref 3.6–5.1)
Alkaline phosphatase (APISO): 62 U/L (ref 35–144)
BUN: 12 mg/dL (ref 7–25)
CO2: 28 mmol/L (ref 20–32)
Calcium: 9.6 mg/dL (ref 8.6–10.3)
Chloride: 107 mmol/L (ref 98–110)
Creat: 1.11 mg/dL (ref 0.70–1.25)
GFR, Est African American: 81 mL/min/{1.73_m2} (ref >=60)
GFR, Est Non African American: 70 mL/min/{1.73_m2} (ref >=60)
Globulin: 2.2 g/dL (calc) (ref 1.9–3.7)
Glucose, Bld: 78 mg/dL (ref 65–99)
Potassium: 4.5 mmol/L (ref 3.5–5.3)
Sodium: 144 mmol/L (ref 135–146)
Total Bilirubin: 1 mg/dL (ref 0.2–1.2)
Total Protein: 6.5 g/dL (ref 6.1–8.1)

## 2019-12-27 LAB — HEPATITIS C ANTIBODY
Hepatitis C Ab: NONREACTIVE
SIGNAL TO CUT-OFF: 0.01 (ref <1.00)

## 2020-01-02 ENCOUNTER — Encounter: Payer: Self-pay | Admitting: Internal Medicine

## 2020-02-09 ENCOUNTER — Ambulatory Visit: Payer: 59 | Admitting: Gastroenterology

## 2020-03-25 ENCOUNTER — Other Ambulatory Visit (INDEPENDENT_AMBULATORY_CARE_PROVIDER_SITE_OTHER): Payer: Self-pay | Admitting: Internal Medicine

## 2020-04-04 ENCOUNTER — Ambulatory Visit (INDEPENDENT_AMBULATORY_CARE_PROVIDER_SITE_OTHER): Payer: 59 | Admitting: Nurse Practitioner

## 2020-07-16 ENCOUNTER — Other Ambulatory Visit: Payer: Self-pay

## 2020-07-16 ENCOUNTER — Encounter (INDEPENDENT_AMBULATORY_CARE_PROVIDER_SITE_OTHER): Payer: Self-pay | Admitting: Internal Medicine

## 2020-07-16 ENCOUNTER — Ambulatory Visit (INDEPENDENT_AMBULATORY_CARE_PROVIDER_SITE_OTHER): Payer: 59 | Admitting: Internal Medicine

## 2020-07-16 VITALS — BP 136/88 | HR 72 | Temp 97.3°F | Resp 18 | Ht 69.0 in | Wt 239.0 lb

## 2020-07-16 DIAGNOSIS — I10 Essential (primary) hypertension: Secondary | ICD-10-CM | POA: Diagnosis not present

## 2020-07-16 DIAGNOSIS — M25562 Pain in left knee: Secondary | ICD-10-CM

## 2020-07-16 DIAGNOSIS — E782 Mixed hyperlipidemia: Secondary | ICD-10-CM

## 2020-07-16 DIAGNOSIS — G8929 Other chronic pain: Secondary | ICD-10-CM

## 2020-07-16 DIAGNOSIS — E669 Obesity, unspecified: Secondary | ICD-10-CM

## 2020-07-16 DIAGNOSIS — F419 Anxiety disorder, unspecified: Secondary | ICD-10-CM

## 2020-07-16 MED ORDER — FLUTICASONE PROPIONATE 50 MCG/ACT NA SUSP: 2.0000 | Freq: Every day | NASAL | 6 refills | Status: AC

## 2020-07-16 MED ORDER — DULOXETINE HCL 60 MG PO CPEP: 60.0000 mg | ORAL_CAPSULE | Freq: Every day | ORAL | 3 refills | Status: DC

## 2020-07-16 NOTE — Progress Notes (Signed)
Having trouble with allergies; having to alternate OTC.  Having trouble sleeping; waking up a lot during the night. Due to stress at work and his wife notices. Seems to bee more distracted more with his thoughts.He wants to discuss.

## 2020-07-16 NOTE — Progress Notes (Signed)
Metrics: Intervention Frequency ACO  Documented Smoking Status Yearly  Screened one or more times in 24 months  Cessation Counseling or  Active cessation medication Past 24 months  Past 24 months   Guideline developer: UpToDate (See UpToDate for funding source) Date Released: 2014       Wellness Office Visit  Subjective:  Patient ID: Ryan Cain, male    DOB: 10/24/1955  Age: 65 y.o. MRN: [id number]  CC: This man comes in after long hiatus for follow-up of hypertension, hyperlipidemia, obesity and anxiety. HPI  He feels that he needs a higher dose of duloxetine as he has been taking 2 of the 30 mg capsules which seem to help him. He continues to take losartan 50 mg daily. He has not lost any significant weight. He continues to complain of chronic left knee pain which she has had for several months but he takes over-the-counter Tylenol and ibuprofen I believe. Past Medical History:  Diagnosis Date   Arthritis    Asthma    as a child   Hyperlipidemia    Hypertension    Situational mixed anxiety and depressive disorder    Situational mixed anxiety and depressive disorder    Past Surgical History:  Procedure Laterality Date   DENTAL SURGERY       Family History  Problem Relation Age of Onset   Arthritis Mother    Hypertension Mother    Hyperlipidemia Mother    Heart disease Father 4       heart attack   Heart disease Paternal Uncle     Social History   Social History Narrative   Lives with wife Ryan Cain who has spina bifida- affected mildly.Married over 40 years.   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   Social History   Tobacco Use   Smoking status: Never   Smokeless tobacco: Never  Substance Use Topics   Alcohol use: Yes    Alcohol/week: 6.0 standard drinks    Types: 6 Shots of liquor per week    Comment: 3x a week bourbon    Current Meds  Medication Sig   Cholecalciferol (VITAMIN D3) 250 MCG (10000 UT) capsule Take 10,000 Units  by mouth daily.   diphenhydrAMINE HCl (BENADRYL ALLERGY PO) Take 1 tablet by mouth daily.   DULoxetine (CYMBALTA) 60 MG capsule Take 1 capsule (60 mg total) by mouth daily.   fluticasone (FLONASE) 50 MCG/ACT nasal spray Place 2 sprays into both nostrils daily.   ibuprofen (ADVIL,MOTRIN) 200 MG tablet Take 200 mg by mouth every 8 (eight) hours as needed.   loratadine (CLARITIN) 10 MG tablet Take 10 mg by mouth daily.   losartan (COZAAR) 50 MG tablet TAKE ONE TABLET BY MOUTH ONCE DAILY.   NON FORMULARY Take 1 Dose by mouth in the morning. Drink Beet Supplement in AM.   Omega-3 Fatty Acids (FISH OIL) 1200 MG CAPS Take 2,400 mg by mouth.   [DISCONTINUED] DULoxetine (CYMBALTA) 30 MG capsule TAKE ONE CAPSULE BY MOUTH ONCE DAILY.     [place] Office Visit from 11/14/2019 in [facility]  PHQ-9 Total Score 5       Objective:   Today's Vitals: BP 136/88 (BP Location: Left Arm, Patient Position: Sitting, Cuff Size: Large)   Pulse 72   Temp (!) 97.3 F (36.3 C) (Temporal)   Resp 18   Ht 5\' 9"  (1.753 m)   Wt 239 lb (108.4 kg)   SpO2 96%   BMI 35.29 kg/m  Vitals  with BMI 07/16/2020 12/26/2019 11/14/2019  Height 5\' 9"  5\' 9"  (No Data)  Weight 239 lbs 236 lbs (No Data)  BMI 27.06 23.76 -  Systolic 283 151 (No Data)  Diastolic 88 76 (No Data)  Pulse 72 66 -     Physical Exam  He remains obese.  Blood pressure is slightly elevated.     Assessment   1. Essential hypertension   2. Anxiety   3. Obesity (BMI 30-39.9)   4. Mixed hyperlipidemia   5. Chronic pain of left knee       Tests ordered Orders Placed This Encounter  Procedures   Basic metabolic panel   Ambulatory referral to Orthopedic Surgery      Plan: 1.  Continue with losartan and we will check renal function. 2.  I will increase his duloxetine to 60 mg daily and I have sent a new prescription to reflect this change. 3.  I will refer him to orthopedics for his left knee pain. 4.  Follow-up with  Ryan Cain in 3 months further recommendations will depend on blood results    Meds ordered this encounter  Medications   DULoxetine (CYMBALTA) 60 MG capsule    Sig: Take 1 capsule (60 mg total) by mouth daily.    Dispense:  30 capsule    Refill:  3   fluticasone (FLONASE) 50 MCG/ACT nasal spray    Sig: Place 2 sprays into both nostrils daily.    Dispense:  16 g    Refill:  6     Ryan Santaella Luther Parody, MD

## 2020-07-17 LAB — BASIC METABOLIC PANEL
BUN: 14 mg/dL (ref 7–25)
CO2: 29 mmol/L (ref 20–32)
Calcium: 9.7 mg/dL (ref 8.6–10.3)
Chloride: 109 mmol/L (ref 98–110)
Creat: 0.99 mg/dL (ref 0.70–1.25)
Glucose, Bld: 66 mg/dL (ref 65–139)
Potassium: 4.5 mmol/L (ref 3.5–5.3)
Sodium: 144 mmol/L (ref 135–146)

## 2020-07-24 ENCOUNTER — Ambulatory Visit: Payer: 59 | Admitting: Orthopedic Surgery

## 2020-07-26 ENCOUNTER — Encounter: Payer: Self-pay | Admitting: Orthopedic Surgery

## 2020-07-26 ENCOUNTER — Ambulatory Visit: Payer: 59

## 2020-07-26 ENCOUNTER — Ambulatory Visit: Payer: 59 | Admitting: Orthopedic Surgery

## 2020-07-26 ENCOUNTER — Other Ambulatory Visit: Payer: Self-pay

## 2020-07-26 VITALS — BP 198/113 | HR 75 | Ht 71.0 in | Wt 231.2 lb

## 2020-07-26 DIAGNOSIS — G8929 Other chronic pain: Secondary | ICD-10-CM

## 2020-07-26 DIAGNOSIS — M1712 Unilateral primary osteoarthritis, left knee: Secondary | ICD-10-CM | POA: Diagnosis not present

## 2020-07-26 NOTE — Progress Notes (Signed)
New Patient Visit  Assessment: Ryan Cain is a 65 y.o. male with the following: Mild left knee arthritis  Plan: Reviewed radiographs with patient clinic today which demonstrate mild degenerative changes overall.  Continues to have well-maintained joint space, but there are some small osteophytes noted.  The description of his pain, as well as the onset and duration is most consistent with arthritis as well.  Discussed multiple options including medications as needed.  We can proceed with a steroid injection if he is interested.  After discussing all the options, he will continue this current medications as needed.  If he has a flare, which resulted in swelling in the knee, as well as restricted range of motion, he will schedule a follow-up appointment for repeat evaluation.  All questions were answered he is amenable this plan.  Follow-up as needed.   Follow-up: Return if symptoms worsen or fail to improve.  Subjective:  Chief Complaint  Patient presents with   Knee Pain    Left knee/no known injury/swelling comes and goes/pt does a lot of walking   New Patient (Initial Visit)    History of Present Illness: Ryan Cain is a 65 y.o. male who has been referred to clinic today by Hurshel Party, MD for evaluation of left knee pain.  He has had intermittent pain in the left knee for several years.  Denies a specific injury.  He notes that he will have occasional swelling of the knee, which causing restrictions in his range of motion.  He has been doing medications as needed.  Has never had an injection.  Has not worked with physical therapy.  He notes pain in both the medial lateral aspects of the knee.  He states he may have had an injury many years ago when he was younger, while playing football.   Review of Systems: No fevers or chills No numbness or tingling No chest pain No shortness of breath No bowel or bladder dysfunction No GI distress No headaches   Medical  History:  Past Medical History:  Diagnosis Date   Arthritis    Asthma    as a child   Hyperlipidemia    Hypertension    Situational mixed anxiety and depressive disorder    Situational mixed anxiety and depressive disorder     Past Surgical History:  Procedure Laterality Date   DENTAL SURGERY      Family History  Problem Relation Age of Onset   Arthritis Mother    Hypertension Mother    Hyperlipidemia Mother    Heart disease Father 46       heart attack   Heart disease Paternal Uncle    Social History   Tobacco Use   Smoking status: Never   Smokeless tobacco: Never  Vaping Use   Vaping Use: Never used  Substance Use Topics   Alcohol use: Yes    Alcohol/week: 6.0 standard drinks    Types: 6 Shots of liquor per week    Comment: 3x a week bourbon   Drug use: No    No Known Allergies  Current Meds  Medication Sig   Cholecalciferol (VITAMIN D3) 250 MCG (10000 UT) capsule Take 10,000 Units by mouth daily.   diphenhydrAMINE HCl (BENADRYL ALLERGY PO) Take 1 tablet by mouth daily.   DULoxetine (CYMBALTA) 60 MG capsule Take 1 capsule (60 mg total) by mouth daily.   fluticasone (FLONASE) 50 MCG/ACT nasal spray Place 2 sprays into both nostrils daily.   ibuprofen (ADVIL,MOTRIN) 200  MG tablet Take 200 mg by mouth every 8 (eight) hours as needed.   loratadine (CLARITIN) 10 MG tablet Take 10 mg by mouth daily.   losartan (COZAAR) 50 MG tablet TAKE ONE TABLET BY MOUTH ONCE DAILY.   NON FORMULARY Take 1 Dose by mouth in the morning. Drink Beet Supplement in AM.   Omega-3 Fatty Acids (FISH OIL) 1200 MG CAPS Take 2,400 mg by mouth.    Objective: BP (!) 198/113   Pulse 75   Ht 5\' 11"  (1.803 m)   Wt 231 lb 3.2 oz (104.9 kg)   BMI 32.25 kg/m   Physical Exam:  General: Elderly male., No acute distress., and Age appropriate behavior. Gait: Left sided antalgic gait.  Evaluation of left knee demonstrates mild prominence over the tibial tubercle.  This is nontender.  No  effusion is appreciated on physical exam today.  He has full and painless range of motion.  Excellent muscle strength in the quadriceps.  Mild tenderness palpation along the medial and lateral joint line.  Negative Lachman.  No increased laxity with varus stress.  Sensation is intact throughout the left foot.  Active motion intact in the TA/EHL  IMAGING: I personally ordered and reviewed the following images  X-ray of the left knee was obtained in clinic today and demonstrates mild degenerative changes overall.  Well-maintained joint space.  Small osteophytes are noted within the medial lateral compartments.  No acute injuries are noted.  Neutral overall alignment.  Impression: Left knee x-rays with mild arthritic changes.   New Medications:  No orders of the defined types were placed in this encounter.     Mordecai Rasmussen, MD  07/26/2020 12:55 PM

## 2020-07-26 NOTE — Patient Instructions (Signed)

## 2020-08-22 ENCOUNTER — Other Ambulatory Visit (INDEPENDENT_AMBULATORY_CARE_PROVIDER_SITE_OTHER): Payer: Self-pay | Admitting: Internal Medicine

## 2020-09-30 ENCOUNTER — Emergency Department (HOSPITAL_COMMUNITY)
Admission: EM | Admit: 2020-09-30 | Discharge: 2020-09-30 | Disposition: A | Discharge status: Home / Self Care | Payer: 59

## 2020-09-30 ENCOUNTER — Other Ambulatory Visit: Payer: Self-pay

## 2020-09-30 ENCOUNTER — Ambulatory Visit
Admission: EM | Admit: 2020-09-30 | Discharge: 2020-09-30 | Disposition: A | Discharge status: Home / Self Care | Payer: 59 | Attending: Family | Admitting: Family

## 2020-09-30 MED ORDER — ACETAMINOPHEN 325 MG PO TABS
650.0000 mg | ORAL_TABLET | Freq: Once | ORAL | Status: AC
  Administered 2020-09-30: 650 mg via ORAL

## 2020-09-30 NOTE — ED Triage Notes (Signed)
Patient is being discharged from the Urgent Care and sent to the Emergency Department via private vehicle  . Per A Amyot NP, patient is in need of higher level of care due to severe abdominal pain with fever . Tylenol given Patient is aware and verbalizes understanding of plan of care.  Vitals:   09/30/20 1006  BP: (!) 199/80  Pulse: 92  Resp: 20  Temp: (!) 101.1 F (38.4 C)  SpO2: 95%

## 2020-09-30 NOTE — ED Triage Notes (Signed)
Pt presents with c/o lower abdominal pain that began Saturday, pt denies nausea or BM changes

## 2020-10-01 ENCOUNTER — Encounter (HOSPITAL_COMMUNITY): Payer: Self-pay

## 2020-10-01 ENCOUNTER — Emergency Department (HOSPITAL_COMMUNITY)
Admission: EM | Admit: 2020-10-01 | Discharge: 2020-10-01 | Disposition: A | Discharge status: Home / Self Care | Payer: 59 | Attending: Emergency Medicine | Admitting: Emergency Medicine

## 2020-10-01 ENCOUNTER — Emergency Department (HOSPITAL_COMMUNITY): Payer: 59

## 2020-10-01 ENCOUNTER — Other Ambulatory Visit: Payer: Self-pay

## 2020-10-01 DIAGNOSIS — R1030 Lower abdominal pain, unspecified: Secondary | ICD-10-CM | POA: Insufficient documentation

## 2020-10-01 DIAGNOSIS — J45909 Unspecified asthma, uncomplicated: Secondary | ICD-10-CM | POA: Diagnosis not present

## 2020-10-01 DIAGNOSIS — M545 Low back pain, unspecified: Secondary | ICD-10-CM | POA: Diagnosis not present

## 2020-10-01 DIAGNOSIS — R109 Unspecified abdominal pain: Secondary | ICD-10-CM

## 2020-10-01 DIAGNOSIS — Z20822 Contact with and (suspected) exposure to covid-19: Secondary | ICD-10-CM | POA: Diagnosis not present

## 2020-10-01 DIAGNOSIS — N492 Inflammatory disorders of scrotum: Secondary | ICD-10-CM | POA: Diagnosis not present

## 2020-10-01 DIAGNOSIS — I10 Essential (primary) hypertension: Secondary | ICD-10-CM | POA: Diagnosis not present

## 2020-10-01 DIAGNOSIS — R509 Fever, unspecified: Secondary | ICD-10-CM | POA: Diagnosis not present

## 2020-10-01 DIAGNOSIS — N5082 Scrotal pain: Secondary | ICD-10-CM

## 2020-10-01 LAB — CBC WITH DIFFERENTIAL/PLATELET
Abs Immature Granulocytes: 0.12 10*3/uL — ABNORMAL HIGH (ref 0.00–0.07)
Basophils Absolute: 0.1 10*3/uL (ref 0.0–0.1)
Basophils Relative: 0 %
Eosinophils Absolute: 0 10*3/uL (ref 0.0–0.5)
Eosinophils Relative: 0 %
HCT: 42.1 % (ref 39.0–52.0)
Hemoglobin: 14.7 g/dL (ref 13.0–17.0)
Immature Granulocytes: 1 %
Lymphocytes Relative: 4 %
Lymphs Abs: 0.8 10*3/uL (ref 0.7–4.0)
MCH: 34.8 pg — ABNORMAL HIGH (ref 26.0–34.0)
MCHC: 34.9 g/dL (ref 30.0–36.0)
MCV: 99.8 fL (ref 80.0–100.0)
Monocytes Absolute: 1.7 10*3/uL — ABNORMAL HIGH (ref 0.1–1.0)
Monocytes Relative: 10 %
Neutro Abs: 14.9 10*3/uL — ABNORMAL HIGH (ref 1.7–7.7)
Neutrophils Relative %: 85 %
Platelets: 166 10*3/uL (ref 150–400)
RBC: 4.22 MIL/uL (ref 4.22–5.81)
RDW: 13.6 % (ref 11.5–15.5)
WBC: 17.6 10*3/uL — ABNORMAL HIGH (ref 4.0–10.5)
nRBC: 0 % (ref 0.0–0.2)

## 2020-10-01 LAB — COMPREHENSIVE METABOLIC PANEL
ALT: 17 U/L (ref 0–44)
AST: 18 U/L (ref 15–41)
Albumin: 3.8 g/dL (ref 3.5–5.0)
Alkaline Phosphatase: 54 U/L (ref 38–126)
Anion gap: 10 (ref 5–15)
BUN: 13 mg/dL (ref 8–23)
CO2: 28 mmol/L (ref 22–32)
Calcium: 9.8 mg/dL (ref 8.9–10.3)
Chloride: 102 mmol/L (ref 98–111)
Creatinine, Ser: 1.19 mg/dL (ref 0.61–1.24)
GFR, Estimated: >60 mL/min (ref >60)
Glucose, Bld: 97 mg/dL (ref 70–99)
Potassium: 4.1 mmol/L (ref 3.5–5.1)
Sodium: 140 mmol/L (ref 135–145)
Total Bilirubin: 1.5 mg/dL — ABNORMAL HIGH (ref 0.3–1.2)
Total Protein: 7.2 g/dL (ref 6.5–8.1)

## 2020-10-01 LAB — URINALYSIS, ROUTINE W REFLEX MICROSCOPIC
Bilirubin Urine: NEGATIVE
Glucose, UA: NEGATIVE mg/dL
Ketones, ur: NEGATIVE mg/dL
Nitrite: NEGATIVE
Protein, ur: 100 mg/dL — AB
Specific Gravity, Urine: 1.021 (ref 1.005–1.030)
WBC, UA: >50 WBC/hpf — ABNORMAL HIGH (ref 0–5)
pH: 5 (ref 5.0–8.0)

## 2020-10-01 LAB — SARS CORONAVIRUS 2 (TAT 6-24 HRS): SARS Coronavirus 2: NEGATIVE

## 2020-10-01 LAB — LIPASE, BLOOD: Lipase: 23 U/L (ref 11–51)

## 2020-10-01 MED ORDER — SODIUM CHLORIDE 0.9 % IV SOLN
2.0000 g | Freq: Once | INTRAVENOUS | Status: AC
  Administered 2020-10-01: 2 g via INTRAVENOUS
  Filled 2020-10-01: qty 20

## 2020-10-01 MED ORDER — CEPHALEXIN 500 MG PO CAPS: 500.0000 mg | ORAL_CAPSULE | Freq: Four times a day (QID) | ORAL | 0 refills | Status: DC

## 2020-10-01 MED ORDER — IOHEXOL 350 MG/ML SOLN
80.0000 mL | Freq: Once | INTRAVENOUS | Status: AC | PRN
  Administered 2020-10-01: 80 mL via INTRAVENOUS

## 2020-10-01 NOTE — ED Provider Notes (Signed)
CT of the abdomen unremarkable.  Ultrasound scrotum shows a hydrocele.  Patient also has urinary tract infection he is given 2 g Rocephin IV and put on Keflex and will follow up with urology next week   Milton Ferguson, MD 10/01/20 1114

## 2020-10-01 NOTE — Discharge Instructions (Signed)
Call make an appointment to follow-up with [facility] next week.  Also follow-up with your family doctor within a couple weeks because your blood pressure was slightly high

## 2020-10-01 NOTE — ED Provider Notes (Signed)
[facility] Emergency Department Provider Note MRN:  [id number]  Arrival date & time: 10/01/20     Chief Complaint   Abdominal Pain   History of Present Illness   Ryan Cain is a 65 y.o. year-old male with a history of hypertension, hyperlipidemia presenting to the ED with chief complaint of abdominal pain.  Location: Lower abdomen and lower back Duration: 1 or 2 days Onset: Sudden Timing: Constant pain Description: Dull ache Severity: Moderate Exacerbating/Alleviating Factors: None Associated Symptoms: Fever, scrotal/testicular enlargement Pertinent Negatives: Denies chest pain or shortness of breath, no nausea vomiting or diarrhea  Additional History: None  Review of Systems  A complete 10 system review of systems was obtained and all systems are negative except as noted in the HPI and PMH.   Patient's Health History    Past Medical History:  Diagnosis Date   Arthritis    Asthma    as a child   Hyperlipidemia    Hypertension    Situational mixed anxiety and depressive disorder    Situational mixed anxiety and depressive disorder     Past Surgical History:  Procedure Laterality Date   DENTAL SURGERY      Family History  Problem Relation Age of Onset   Arthritis Mother    Hypertension Mother    Hyperlipidemia Mother    Heart disease Father 106       heart attack   Heart disease Paternal Uncle     Social History   Socioeconomic History   Marital status: Married    Spouse name: Altha Harm   Number of children: 1   Years of education: 13   Highest education level: Not on file  Occupational History   Occupation: Librarian, 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   Social Determinants of Health   Financial Resource Strain: Not on file  Food Insecurity: Not on file  Transportation Needs: Not on file  Physical Activity: Not on file  Stress: Not on file  Social Connections: Not on file  Intimate Partner Violence: Not on file     Physical Exam   Vitals:   10/01/20 0616  BP: (!) 163/86  Pulse: 91  Resp: 18  Temp: 97.8 F (36.6 C)  SpO2: 100%    CONSTITUTIONAL: Well-appearing, NAD NEURO:  Alert and oriented x 3, no focal deficits EYES:  eyes equal and reactive ENT/NECK:  no LAD, no JVD CARDIO: Regular rate, well-perfused, normal S1 and S2 PULM:  CTAB no wheezing or rhonchi GI/GU:  normal bowel sounds, non-distended, non-tender; significantly enlarged right testicle, nontender MSK/SPINE:  No gross deformities, no edema SKIN:  no rash, atraumatic PSYCH:  Appropriate speech and behavior  *Additional and/or pertinent findings included in MDM below  Diagnostic and Interventional Summary    EKG Interpretation  Date/Time:    Ventricular Rate:  PR Interval:    QRS Duration:   QT Interval:    QTC Calculation:   R Axis:     Text Interpretation:         Labs Reviewed  CBC WITH DIFFERENTIAL/PLATELET  COMPREHENSIVE METABOLIC PANEL  URINALYSIS, ROUTINE W REFLEX MICROSCOPIC  LIPASE, BLOOD    US Scrotum    (Results Pending)  CT ABDOMEN PELVIS W CONTRAST    (Results Pending)    Medications - No data to display   Procedures  /  Critical Care Procedures  ED Course and Medical Decision Making  I have reviewed the triage vital signs, the nursing notes, and pertinent available records from the EMR.  Listed above are laboratory and imaging tests that I  personally ordered, reviewed, and interpreted and then considered in my medical decision making (see below for details).  Abdominal pain plus fever plus testicular enlargement, considering intra-abdominal infection such as appendicitis, considering aortic pathology, orchitis or epididymitis, hernia.  Awaiting labs, imaging.  Signed out to oncoming provider at shift change.       Barth Kirks. [place]onia Small, [place] [email or web address]  Final Clinical Impressions(s) / ED Diagnoses     ICD-10-CM   1. Abdominal pain, unspecified abdominal location  R10.9       ED Discharge Orders     None        Discharge Instructions Discussed with and Provided to Patient:   Discharge Instructions   None       Maudie Flakes, MD 10/01/20 [phone]

## 2020-10-01 NOTE — ED Triage Notes (Addendum)
Pt reports rlq pain and and pain in r lower back since Saturday.  Was taking tylenol and using ice and heat.  Went to urgent care yesterday and was sent to ed. Reports he had a fever 101 yesterday.  Pt says he came to the ED but the wait was too long so he decided to come back this morning.  Pt says noticed testicles swelling yesterday and reports are tender.  Denies any urinary symptoms, denies any n/v/d.  LBM was this morning.

## 2020-10-04 LAB — URINE CULTURE: Culture: >=100000 — AB

## 2020-10-06 ENCOUNTER — Telehealth: Payer: Self-pay | Admitting: Emergency Medicine

## 2020-10-06 NOTE — Telephone Encounter (Signed)
Post ED Visit - Positive Culture Follow-up  Culture report reviewed by antimicrobial stewardship pharmacist: [place] Team '[]'$  Elenor Quinones, Pharm.D. '[]'$  Heide Guile, Pharm.D., BCPS AQ-ID '[]'$  Parks Neptune, Pharm.D., BCPS '[]'$  Alycia Rossetti, Pharm.D., BCPS '[]'$  [place], Pharm.D., BCPS, AAHIVP '[]'$  Legrand Como, Pharm.D., BCPS, AAHIVP '[x]'$  Salome Arnt, PharmD, BCPS '[]'$  Johnnette Gourd, PharmD, BCPS '[]'$  Hughes Better, PharmD, BCPS '[]'$  Leeroy Cha, PharmD '[]'$  Laqueta Linden, PharmD, BCPS '[]'$  Albertina Parr, PharmD  [place] Team '[]'$  Leodis Sias, PharmD '[]'$  Lindell Spar, PharmD '[]'$  Royetta Asal, PharmD '[]'$  Graylin Shiver, Rph '[]'$  Rema Fendt) Glennon Mac, PharmD '[]'$  Arlyn Dunning, PharmD '[]'$  Netta Cedars, PharmD '[]'$  Dia Sitter, PharmD '[]'$  Leone Haven, PharmD '[]'$  Gretta Arab, PharmD '[]'$  Theodis Shove, PharmD '[]'$  Peggyann Juba, PharmD '[]'$  Reuel Boom, PharmD   Positive urine culture Treated with Cephalexin, organism sensitive to the same and no further patient follow-up is required at this time.  Sandi Raveling Wilson Sample 10/06/2020, 11:37 AM

## 2020-10-16 ENCOUNTER — Ambulatory Visit (INDEPENDENT_AMBULATORY_CARE_PROVIDER_SITE_OTHER): Payer: 59 | Admitting: Nurse Practitioner

## 2020-10-21 ENCOUNTER — Encounter: Payer: Self-pay | Admitting: Urology

## 2020-10-21 ENCOUNTER — Ambulatory Visit (INDEPENDENT_AMBULATORY_CARE_PROVIDER_SITE_OTHER): Payer: 59 | Admitting: Urology

## 2020-10-21 ENCOUNTER — Other Ambulatory Visit: Payer: Self-pay

## 2020-10-21 VITALS — BP 141/91 | HR 144 | Temp 98.1°F | Ht 72.0 in | Wt 229.0 lb

## 2020-10-21 DIAGNOSIS — R3129 Other microscopic hematuria: Secondary | ICD-10-CM

## 2020-10-21 DIAGNOSIS — N400 Enlarged prostate without lower urinary tract symptoms: Secondary | ICD-10-CM

## 2020-10-21 DIAGNOSIS — N453 Epididymo-orchitis: Secondary | ICD-10-CM | POA: Diagnosis not present

## 2020-10-21 LAB — MICROSCOPIC EXAMINATION
Bacteria, UA: NONE SEEN
Epithelial Cells (non renal): NONE SEEN /hpf (ref 0–10)
Renal Epithel, UA: NONE SEEN /hpf
WBC, UA: NONE SEEN /hpf (ref 0–5)

## 2020-10-21 LAB — URINALYSIS, ROUTINE W REFLEX MICROSCOPIC
Bilirubin, UA: NEGATIVE
Glucose, UA: NEGATIVE
Ketones, UA: NEGATIVE
Leukocytes,UA: NEGATIVE
Nitrite, UA: NEGATIVE
Specific Gravity, UA: 1.025 (ref 1.005–1.030)
Urobilinogen, Ur: 0.2 mg/dL (ref 0.2–1.0)
pH, UA: 5.5 (ref 5.0–7.5)

## 2020-10-21 NOTE — Progress Notes (Signed)
10/21/2020 3:04 PM   Ryan Cain [birth year] [id number]  Referring provider: Milton Ferguson, MD [place],  [place] [id number]  No chief complaint on file.   HPI:  New patient-  1) epididymoorchitis-Dhillon is a 65 year old male who had right testicular pain and swelling.  He presented to emergency 10/01/2020 where his white count was 17.6 and scrotal ultrasound revealed a left hydrocele 1.9 cm, right hydrocele 4 cm with some right testicular swelling.  There was no mass.  His urine culture grew E. coli.  He was treated with cephalexin.  He is voiding well and denies any dysuria or gross hematuria.  His swelling and pain have just about resolved.   2) BPH - his PSA October 2021 was 2.9. Usually an adequate stream. No bothersome LUTS. Noc x 0-2.   3) MH - he has 3-10 rbc on his UA today, 10/22. No gross hematuria. Non-smoker. CT 10/01/2020 benign.    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   PMH: Past Medical History:  Diagnosis Date   Arthritis    Asthma    as a child   Hyperlipidemia    Hypertension    Situational mixed anxiety and depressive disorder    Situational mixed anxiety and depressive disorder     Surgical History: Past Surgical History:  Procedure Laterality Date   DENTAL SURGERY      Home Medications:  Allergies as of 10/21/2020   No Known Allergies      Medication List        Accurate as of October 21, 2020  3:04 PM. If you have any questions, ask your nurse or doctor.          STOP taking these medications    cephALEXin 500 MG capsule Commonly known as: KEFLEX Stopped by: Festus Aloe, MD       TAKE these medications    BENADRYL ALLERGY PO Take 1 tablet by mouth daily.   DULoxetine 60 MG capsule Commonly known as: CYMBALTA Take 1 capsule (60 mg total) by mouth daily.   Fish Oil 1200 MG Caps Take 2,400 mg by mouth.   fluticasone 50 MCG/ACT nasal spray Commonly known as: FLONASE Place 2 sprays into both  nostrils daily.   ibuprofen 200 MG tablet Commonly known as: ADVIL Take 200 mg by mouth every 8 (eight) hours as needed.   loratadine 10 MG tablet Commonly known as: CLARITIN Take 10 mg by mouth daily.   losartan 50 MG tablet Commonly known as: COZAAR TAKE ONE TABLET BY MOUTH ONCE DAILY.   Vitamin D3 250 MCG (10000 UT) capsule Take 10,000 Units by mouth daily.        Allergies: No Known Allergies  Family History: Family History  Problem Relation Age of Onset   Arthritis Mother    Hypertension Mother    Hyperlipidemia Mother    Heart disease Father 80       heart attack   Heart disease Paternal Uncle     Social History:  reports that he has never smoked. He has never used smokeless tobacco. He reports current alcohol use of about 6.0 standard drinks per week. He reports that he does not use drugs.   Physical Exam: BP (!) 141/91 (BP Location: Right Arm, Patient Position: Sitting, Cuff Size: Normal)   Pulse (!) 144   Temp 98.1 F (36.7 C)   Ht 6' (1.829 m)   Wt 229 lb (103.9 kg)   BMI 31.06 kg/m  Constitutional:  Alert and oriented, No acute distress. HEENT: [place] AT, moist mucus membranes.  Trachea midline, no masses. Cardiovascular: No clubbing, cyanosis, or edema. Respiratory: Normal respiratory effort, no increased work of breathing. GI: Abdomen is soft, nontender, nondistended, no abdominal masses GU: No CVA tenderness Lymph: No cervical or inguinal lymphadenopathy. Skin: No rashes, bruises or suspicious lesions. Neurologic: Grossly intact, no focal deficits, moving all 4 extremities. Psychiatric: Normal mood and affect. GU: Penis uncircumcised, normal foreskin, testicles descended bilaterally and palpably normal, bilateral epididymis palpably normal with a 10 mm left spermatocele, right hydrocele 3-5 cm, scrotum normal DRE: Prostate 40 g, smooth without hard area or nodule   Laboratory Data: Lab Results  Component Value Date   WBC 17.6 (H) 10/01/2020    HGB 14.7 10/01/2020   HCT 42.1 10/01/2020   MCV 99.8 10/01/2020   PLT 166 10/01/2020    Lab Results  Component Value Date   CREATININE 1.19 10/01/2020    No results found for: PSA  No results found for: TESTOSTERONE  Lab Results  Component Value Date   HGBA1C 5.0 11/14/2019    Urinalysis    Component Value Date/Time   COLORURINE AMBER (A) 10/01/2020 0619   APPEARANCEUR CLOUDY (A) 10/01/2020 0619   LABSPEC 1.021 10/01/2020 0619   PHURINE 5.0 10/01/2020 0619   GLUCOSEU NEGATIVE 10/01/2020 0619   HGBUR MODERATE (A) 10/01/2020 [place] 10/01/2020 0619   [place] 10/01/2020 0619   PROTEINUR 100 (A) 10/01/2020 0619   NITRITE NEGATIVE 10/01/2020 0619   LEUKOCYTESUR LARGE (A) 10/01/2020 0619    Lab Results  Component Value Date   BACTERIA MANY (A) 10/01/2020    Pertinent Imaging: Scrotal US 09/22 and CT scan  No results found for this or any previous visit.  No results found for this or any previous visit.  No results found for this or any previous visit.  No results found for this or any previous visit.  No results found for this or any previous visit.  No results found for this or any previous visit.  No results found for this or any previous visit.  No results found for this or any previous visit.   Assessment & Plan:    EO - resolved.   BPH - benign exam today - check PSA in 3 mo.   MH - imaging benign - f/u for cystoscopy.   No follow-ups on file.  Festus Aloe, MD  [facility]  [address] [place], [place] [address] [phone]

## 2020-10-21 NOTE — Progress Notes (Signed)
Urological Symptom Review  Patient is experiencing the following symptoms: Get up at night to urinate Leakage of urine Trouble starting stream   Review of Systems  Gastrointestinal (upper)  : Negative for upper GI symptoms  Gastrointestinal (lower) : Negative for lower GI symptoms  Constitutional : Night Sweats  Skin: Negative for skin symptoms  Eyes: Negative for eye symptoms  Ear/Nose/Throat : Negative for Ear/Nose/Throat symptoms  Hematologic/Lymphatic: Swollen glands  Cardiovascular : Negative for cardiovascular symptoms  Respiratory : Negative for respiratory symptoms  Endocrine: Negative for endocrine symptoms  Musculoskeletal: Negative for musculoskeletal symptoms  Neurological: Negative for neurological symptoms  Psychologic: Negative for psychiatric symptoms

## 2020-11-02 ENCOUNTER — Other Ambulatory Visit (INDEPENDENT_AMBULATORY_CARE_PROVIDER_SITE_OTHER): Payer: Self-pay | Admitting: Nurse Practitioner

## 2020-11-25 ENCOUNTER — Encounter: Payer: Self-pay | Admitting: Urology

## 2020-11-25 ENCOUNTER — Ambulatory Visit (INDEPENDENT_AMBULATORY_CARE_PROVIDER_SITE_OTHER): Payer: 59 | Admitting: Urology

## 2020-11-25 ENCOUNTER — Other Ambulatory Visit: Payer: Self-pay

## 2020-11-25 VITALS — BP 197/97 | HR 87 | Temp 98.7°F

## 2020-11-25 DIAGNOSIS — N400 Enlarged prostate without lower urinary tract symptoms: Secondary | ICD-10-CM | POA: Diagnosis not present

## 2020-11-25 DIAGNOSIS — R3129 Other microscopic hematuria: Secondary | ICD-10-CM

## 2020-11-25 LAB — URINALYSIS, ROUTINE W REFLEX MICROSCOPIC
Bilirubin, UA: NEGATIVE
Glucose, UA: NEGATIVE
Ketones, UA: NEGATIVE
Leukocytes,UA: NEGATIVE
Nitrite, UA: NEGATIVE
Protein,UA: NEGATIVE
Specific Gravity, UA: 1.025 (ref 1.005–1.030)
Urobilinogen, Ur: 1 mg/dL (ref 0.2–1.0)
pH, UA: 6.5 (ref 5.0–7.5)

## 2020-11-25 LAB — MICROSCOPIC EXAMINATION
Bacteria, UA: NONE SEEN
Epithelial Cells (non renal): NONE SEEN /hpf (ref 0–10)
Renal Epithel, UA: NONE SEEN /hpf
WBC, UA: NONE SEEN /hpf (ref 0–5)

## 2020-11-25 MED ORDER — CIPROFLOXACIN HCL 500 MG PO TABS: 500.0000 mg | ORAL_TABLET | Freq: Once | ORAL | Status: DC

## 2020-11-25 NOTE — Progress Notes (Signed)
   11/25/20  HPI:  F/u -   1) epididymoorchitis-right testicular pain and swelling 10/01/2020 where his white count was 17.6 and scrotal ultrasound revealed a left hydrocele 1.9 cm, right hydrocele 4 cm with some right testicular swelling at ED.  There was no mass.  His urine culture grew E. coli.  He was treated with cephalexin.  He is voiding well and denies any dysuria or gross hematuria.  His swelling and pain have just about resolved.    2) BPH - his PSA October 2021 was 2.9. Prostate with only mild BPH on Sep 2022 CT. Usually an adequate stream. No bothersome LUTS. Noc x 0-2.    3) MH - he has 3-10 rbc on his UA Oct 2022. No gross hematuria. Non-smoker. CT 10/01/2020 benign.      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   He returns for symptom check, exam and cystoscopy.  PSA was drawn prior to cystoscopy.   There were no vitals taken for this visit. NED. A&Ox3.   No respiratory distress   Abd soft, NT, ND Normal phallus, uncircumcised, foreskin normal without mass or lesion, glans and meatus normal. Bilateral descended testicles - no swelling or mass.   Cystoscopy Procedure Note  Patient identification was confirmed, informed consent was obtained, and patient was prepped using Betadine solution.  Lidocaine jelly was administered per urethral meatus.     Pre-Procedure: - Inspection reveals a normal caliber ureteral meatus.  Procedure: The flexible cystoscope was introduced without difficulty - No urethral strictures/lesions are present. -  prostate obstructing with lateral lobe hypertrophy - bladder neck normal - Bilateral ureteral orifices identified - Bladder mucosa  reveals no ulcers, tumors, or lesions - No bladder stones - No trabeculation  Retroflexion shows normal bladder and bladder neck   Post-Procedure: - Patient tolerated the procedure well  Assessment/ Plan: Microscopic hematuria-benign evaluation  Epididymoorchitis-resolved.  Discussed with  patient he needs to make sure he drinks plenty of water.  Not too much caffeine or alcohol.  BPH-PSA was sent  No follow-ups on file.  Festus Aloe, MD

## 2020-11-25 NOTE — Progress Notes (Signed)
Urological Symptom Review  Patient is experiencing the following symptoms: Get up at night to urinate Leakage of urine   Review of Systems  Gastrointestinal (upper)  : Negative for upper GI symptoms  Gastrointestinal (lower) : Negative for lower GI symptoms  Constitutional : Fatigue  Skin: Negative for skin symptoms  Eyes: Negative for eye symptoms  Ear/Nose/Throat : Negative for Ear/Nose/Throat symptoms  Hematologic/Lymphatic: Negative for Hematologic/Lymphatic symptoms  Cardiovascular : Leg swelling  Respiratory : Negative for respiratory symptoms  Endocrine: Negative for endocrine symptoms  Musculoskeletal: Joint pain  Neurological: Negative for neurological symptoms  Psychologic: Depression Anxiety

## 2020-11-26 LAB — PSA: Prostate Specific Ag, Serum: 2.3 ng/mL (ref 0.0–4.0)

## 2020-11-27 ENCOUNTER — Ambulatory Visit (INDEPENDENT_AMBULATORY_CARE_PROVIDER_SITE_OTHER): Payer: 59 | Admitting: Internal Medicine

## 2020-11-27 ENCOUNTER — Encounter: Payer: Self-pay | Admitting: Internal Medicine

## 2020-11-27 ENCOUNTER — Other Ambulatory Visit: Payer: Self-pay

## 2020-11-27 VITALS — BP 162/94 | HR 70 | Temp 97.9°F | Resp 18 | Ht 72.0 in | Wt 234.0 lb

## 2020-11-27 DIAGNOSIS — Z0001 Encounter for general adult medical examination with abnormal findings: Secondary | ICD-10-CM

## 2020-11-27 DIAGNOSIS — I10 Essential (primary) hypertension: Secondary | ICD-10-CM

## 2020-11-27 DIAGNOSIS — Z23 Encounter for immunization: Secondary | ICD-10-CM

## 2020-11-27 DIAGNOSIS — E6609 Other obesity due to excess calories: Secondary | ICD-10-CM | POA: Diagnosis not present

## 2020-11-27 DIAGNOSIS — Z0000 Encounter for general adult medical examination without abnormal findings: Secondary | ICD-10-CM

## 2020-11-27 DIAGNOSIS — M170 Bilateral primary osteoarthritis of knee: Secondary | ICD-10-CM

## 2020-11-27 DIAGNOSIS — I700 Atherosclerosis of aorta: Secondary | ICD-10-CM

## 2020-11-27 DIAGNOSIS — E782 Mixed hyperlipidemia: Secondary | ICD-10-CM | POA: Diagnosis not present

## 2020-11-27 DIAGNOSIS — F411 Generalized anxiety disorder: Secondary | ICD-10-CM | POA: Insufficient documentation

## 2020-11-27 MED ORDER — LOSARTAN POTASSIUM 100 MG PO TABS: 100.0000 mg | ORAL_TABLET | Freq: Every day | ORAL | 1 refills | Status: DC

## 2020-11-27 MED ORDER — DULOXETINE HCL 60 MG PO CPEP: 60.0000 mg | ORAL_CAPSULE | Freq: Every day | ORAL | 1 refills | Status: DC

## 2020-11-27 NOTE — Assessment & Plan Note (Signed)
Check lipid profile Plan to start statin if persistent HLD

## 2020-11-27 NOTE — Assessment & Plan Note (Signed)
Incidental finding on imaging Check lipid profile Start statin if persistent HLD

## 2020-11-27 NOTE — Assessment & Plan Note (Signed)
Has lost more than 40 lbs with diet modification Moderate exercise/walking advised

## 2020-11-27 NOTE — Patient Instructions (Signed)
Please start taking Losartan 100 mg instead 50 mg once daily.  Continue to take other medications as prescribed.  Please follow DASH diet and perform moderate exercise/walking at least 150 mins/week.

## 2020-11-27 NOTE — Assessment & Plan Note (Signed)
BP Readings from Last 1 Encounters:  11/27/20 (!) 162/94   uncontrolled with losartan 50 mg daily Increased losartan dose 200 mg daily Counseled for compliance with the medications Advised DASH diet and moderate exercise/walking, at least 150 mins/week

## 2020-11-27 NOTE — Progress Notes (Addendum)
New Patient Office Visit  Subjective:  Patient ID: Ryan Cain, male    DOB: [birth year]  Age: 65 y.o. MRN: [id number]  CC:  Chief Complaint  Patient presents with   New Patient (Initial Visit)    New patient was seeing dr Anastasio Champion     HPI [name] is a 65 y.o. male with past medical history of HTN, HLD, GAD and OA of knee who presents for establishing care.  HTN: His BP was elevated in the office today. He has been taking Losartan regularly. He denies any headache, dizziness, chest pain, dyspnea or palpitations.  GAD: He takes Cymbalta for GAD.  He denies any recent spells of anxiety.  Denies any anhedonia, SI or HI.  OA of knee: Followed by orthopedic surgeon.  Takes Tylenol as needed.  He has had 3 doses of COVID-vaccine.  He received flu vaccine in the office today.    Past Medical History:  Diagnosis Date   Arthritis    Asthma    as a child   Hyperlipidemia    Hypertension    Situational mixed anxiety and depressive disorder    Situational mixed anxiety and depressive disorder     Past Surgical History:  Procedure Laterality Date   DENTAL SURGERY      Family History  Problem Relation Age of Onset   Arthritis Mother    Hypertension Mother    Hyperlipidemia Mother    Heart disease Father 31       heart attack   Heart disease Paternal Uncle     Social History   Socioeconomic History   Marital status: Married    Spouse name: Altha Harm   Number of children: 1   Years of education: 13   Highest education level: Not on file  Occupational History   Occupation: Librarian, 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   Social Determinants of Health   Financial Resource Strain: Not on file  Food Insecurity: Not on file  Transportation Needs: Not on file  Physical Activity: Not on file  Stress: Not on file  Social Connections: Not on file  Intimate Partner Violence: Not on file    ROS Review of Systems  Constitutional:  Negative for chills and fever.  HENT:  Negative for congestion and sore throat.   Eyes:  Negative for pain and discharge.  Respiratory:  Negative for cough and shortness of breath.   Cardiovascular:  Negative for chest pain and palpitations.  Gastrointestinal:  Negative for constipation, diarrhea, nausea and vomiting.  Endocrine: Negative for polydipsia and polyuria.  Genitourinary:  Negative for dysuria and hematuria.  Musculoskeletal:  Positive for arthralgias (B/l knee). Negative for neck pain and neck stiffness.  Skin:  Negative for rash.  Neurological:  Negative for dizziness, weakness, numbness and headaches.  Psychiatric/Behavioral:  Negative for agitation and  behavioral problems.    Objective:   Today's Vitals: BP (!) 162/94 (BP Location: Left Arm, Cuff Size: Normal)   Pulse 70   Temp 97.9 F (36.6 C) (Oral)   Resp 18   Ht 6' (1.829 m)   Wt 234 lb (106.1 kg)   SpO2 98%   BMI 31.74 kg/m   Physical Exam Vitals reviewed.  Constitutional:      General: He is not in acute distress.    Appearance: He is not diaphoretic.  HENT:     Head: Normocephalic and atraumatic.     Nose: Nose normal.     Mouth/Throat:     Mouth: Mucous membranes are moist.  Eyes:     General: No scleral icterus.    Extraocular Movements: Extraocular movements intact.  Cardiovascular:     Rate and Rhythm: Normal rate and regular rhythm.     Pulses: Normal pulses.     Heart  sounds: Normal heart sounds. No murmur heard. Pulmonary:     Breath sounds: Normal breath sounds. No wheezing or rales.  Musculoskeletal:     Cervical back: Neck supple. No tenderness.     Right lower leg: No edema.     Left lower leg: No edema.  Skin:    General: Skin is warm.     Findings: No rash.  Neurological:     General: No focal deficit present.     Mental Status: He is alert and oriented to person, place, and time.  Psychiatric:        Mood and Affect: Mood normal.        Behavior: Behavior normal.    Assessment & Plan:   Problem List Items Addressed This Visit       Cardiovascular and Mediastinum   Essential hypertension - Primary    BP Readings from Last 1 Encounters:  11/27/20 (!) 162/94  uncontrolled with losartan 50 mg daily Increased losartan dose to 100 mg daily Counseled for compliance with the medications Advised DASH diet and moderate exercise/walking, at least 150 mins/week       Relevant Medications   losartan (COZAAR) 100 MG tablet   Other Relevant Orders   CMP14+EGFR   Atherosclerosis of abdominal aorta (HCC)    Incidental finding on imaging Check lipid profile Start statin if persistent HLD      Relevant Medications   losartan (COZAAR) 100 MG tablet     Musculoskeletal and Integument   Osteoarthritis of both knees    Followed by orthopedic surgeon Tylenol as needed        Other   HLD (hyperlipidemia)    Check lipid profile Plan to start statin if persistent HLD      Relevant Medications   losartan (COZAAR) 100 MG tablet   Other Relevant Orders   CBC with Differential/Platelet   Obesity    Has lost more than 40 lbs with diet modification Moderate exercise/walking advised      GAD (generalized anxiety disorder)    Well controlled with Cymbalta 60 mg QD, refilled      Relevant Medications   DULoxetine (CYMBALTA) 60 MG capsule   Other Visit Diagnoses     Need for immunization against influenza       Relevant Orders    Flu Vaccine QUAD High Dose(Fluad) (Completed)     Outpatient Encounter Medications as of 11/27/2020  Medication Sig   Cholecalciferol (VITAMIN D3) 250 MCG (10000 UT) capsule Take 10,000 Units by mouth daily.   diphenhydrAMINE HCl (BENADRYL  ALLERGY PO) Take 1 tablet by mouth daily.   fluticasone (FLONASE) 50 MCG/ACT nasal spray Place 2 sprays into both nostrils daily.   ibuprofen (ADVIL,MOTRIN) 200 MG tablet Take 200 mg by mouth every 8 (eight) hours as needed.   loratadine (CLARITIN) 10 MG tablet Take 10 mg by mouth daily.   Omega-3 Fatty Acids (FISH OIL) 1200 MG CAPS Take 2,400 mg by mouth.   [DISCONTINUED] DULoxetine (CYMBALTA) 60 MG capsule Take 1 capsule (60 mg total) by mouth daily.   [DISCONTINUED] losartan (COZAAR) 50 MG tablet TAKE ONE TABLET BY MOUTH ONCE DAILY.   DULoxetine (CYMBALTA) 60 MG capsule Take 1 capsule (60 mg total) by mouth daily.   losartan (COZAAR) 100 MG tablet Take 1 tablet (100 mg total) by mouth daily.   [DISCONTINUED] ciprofloxacin (CIPRO) tablet 500 mg    No facility-administered encounter medications on file as of 11/27/2020.    Follow-up: Return in about 6 weeks (around 01/08/2021) for Annual physical.   Lindell Spar, MD

## 2020-11-27 NOTE — Assessment & Plan Note (Signed)
Followed by orthopedic surgeon Tylenol as needed

## 2020-11-27 NOTE — Assessment & Plan Note (Signed)
Well controlled with Cymbalta 60 mg QD, refilled

## 2020-12-23 ENCOUNTER — Other Ambulatory Visit: Payer: Self-pay

## 2020-12-23 ENCOUNTER — Ambulatory Visit (INDEPENDENT_AMBULATORY_CARE_PROVIDER_SITE_OTHER): Payer: 59 | Admitting: Urology

## 2020-12-23 ENCOUNTER — Encounter: Payer: Self-pay | Admitting: Urology

## 2020-12-23 VITALS — BP 176/96 | HR 73 | Wt 234.0 lb

## 2020-12-23 DIAGNOSIS — N400 Enlarged prostate without lower urinary tract symptoms: Secondary | ICD-10-CM

## 2020-12-23 DIAGNOSIS — R3129 Other microscopic hematuria: Secondary | ICD-10-CM | POA: Diagnosis not present

## 2020-12-23 LAB — URINALYSIS, ROUTINE W REFLEX MICROSCOPIC
Bilirubin, UA: NEGATIVE
Glucose, UA: NEGATIVE
Ketones, UA: NEGATIVE
Leukocytes,UA: NEGATIVE
Nitrite, UA: NEGATIVE
Protein,UA: NEGATIVE
RBC, UA: NEGATIVE
Specific Gravity, UA: 1.02 (ref 1.005–1.030)
Urobilinogen, Ur: 1 mg/dL (ref 0.2–1.0)
pH, UA: 7 (ref 5.0–7.5)

## 2020-12-23 NOTE — Progress Notes (Signed)
Urological Symptom Review  Patient is experiencing the following symptoms: Get up at night to urinate Leakage of urine Erection problems (male only)   Review of Systems  Gastrointestinal (upper)  : Negative for upper GI symptoms  Gastrointestinal (lower) : Negative for lower GI symptoms  Constitutional : Fatigue  Skin: Negative for skin symptoms  Eyes: Negative for eye symptoms  Ear/Nose/Throat : Negative for Ear/Nose/Throat symptoms  Hematologic/Lymphatic: Negative for Hematologic/Lymphatic symptoms  Cardiovascular : Negative for cardiovascular symptoms  Respiratory : Negative for respiratory symptoms  Endocrine: Negative for endocrine symptoms  Musculoskeletal: Joint pain  Neurological: Negative for neurological symptoms  Psychologic: Negative for psychiatric symptoms

## 2020-12-23 NOTE — Progress Notes (Signed)
12/23/2020 3:03 PM   Ryan Cain Nov 27, 1955 [id number]  Referring provider: No referring provider defined for this encounter.  No chief complaint on file.   HPI: F/u -   1) MH - he had 3-10 rbc on his UA Oct 2022. No gross hematuria. Non-smoker. CT 10/01/2020 benign. Cysto and testicular exam benign / normal Nov 2022.   2) BPH - his PSA October 2021 was 2.9 and Nov 2022 PSA 2.3. Prostate with only mild BPH on Sep 2022 CT. Usually an adequate stream. No bothersome LUTS. Noc x 0-2.   3) epididymoorchitis-right testicular pain and swelling 10/01/2020 where his white count was 17.6 and scrotal ultrasound revealed a left hydrocele 1.9 cm, right hydrocele 4 cm with some right testicular swelling at ED.  There was no mass.  His urine culture grew E. coli.  He was treated with cephalexin.  He is voiding well and denies any dysuria or gross hematuria.  His swelling and pain have just about resolved.      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    He returns and is well. No bothersome LUTS. PSA was low. No dysuria. Working on getting BP lower.    PMH: Past Medical History:  Diagnosis Date   Arthritis    Asthma    as a child   Hyperlipidemia    Hypertension    Situational mixed anxiety and depressive disorder    Situational mixed anxiety and depressive disorder     Surgical History: Past Surgical History:  Procedure Laterality Date   DENTAL SURGERY      Home Medications:  Allergies as of 12/23/2020   No Known Allergies      Medication List        Accurate as of December 23, 2020  3:03 PM. If you have any questions, ask your nurse or doctor.          BENADRYL ALLERGY PO Take 1 tablet by mouth daily.   DULoxetine 60 MG capsule Commonly known as: CYMBALTA Take 1 capsule (60 mg total) by mouth daily.   Fish Oil 1200 MG Caps Take 2,400 mg by mouth.   fluticasone 50 MCG/ACT nasal spray Commonly known as: FLONASE Place 2 sprays into both nostrils daily.    ibuprofen 200 MG tablet Commonly known as: ADVIL Take 200 mg by mouth every 8 (eight) hours as needed.   loratadine 10 MG tablet Commonly known as: CLARITIN Take 10 mg by mouth daily.   losartan 100 MG tablet Commonly known as: COZAAR Take 1 tablet (100 mg total) by mouth daily.   Vitamin D3 250 MCG (10000 UT) capsule Take 10,000 Units by mouth daily.        Allergies: No Known Allergies  Family History: Family History  Problem Relation Age of Onset   Arthritis Mother    Hypertension Mother    Hyperlipidemia Mother    Heart disease Father 74       heart attack   Heart disease Paternal Uncle     Social History:  reports that he has never smoked. He has never used smokeless tobacco. He reports current alcohol use of about 6.0 standard drinks per week. He reports that he does not use drugs.   Physical Exam: There were no vitals taken for this visit.  Constitutional:  Alert and oriented, No acute distress. HEENT: [place] AT, moist mucus membranes.  Trachea midline, no masses. Cardiovascular: No clubbing, cyanosis, or edema. Respiratory: Normal respiratory effort, no increased  work of breathing. GI: Abdomen is soft, nontender, nondistended, no abdominal masses GU: No CVA tenderness Skin: No rashes, bruises or suspicious lesions. Neurologic: Grossly intact, no focal deficits, moving all 4 extremities. Psychiatric: Normal mood and affect.  Laboratory Data: Lab Results  Component Value Date   WBC 17.6 (H) 10/01/2020   HGB 14.7 10/01/2020   HCT 42.1 10/01/2020   MCV 99.8 10/01/2020   PLT 166 10/01/2020    Lab Results  Component Value Date   CREATININE 1.19 10/01/2020    No results found for: PSA  No results found for: TESTOSTERONE  Lab Results  Component Value Date   HGBA1C 5.0 11/14/2019    Urinalysis    Component Value Date/Time   COLORURINE AMBER (A) 10/01/2020 0619   APPEARANCEUR Clear 11/25/2020 1635   LABSPEC 1.021 10/01/2020 0619   PHURINE 5.0  10/01/2020 0619   GLUCOSEU Negative 11/25/2020 1635   HGBUR MODERATE (A) 10/01/2020 0619   BILIRUBINUR Negative 11/25/2020 1635   KETONESUR NEGATIVE 10/01/2020 0619   PROTEINUR Negative 11/25/2020 1635   PROTEINUR 100 (A) 10/01/2020 0619   NITRITE Negative 11/25/2020 1635   NITRITE NEGATIVE 10/01/2020 0619   LEUKOCYTESUR Negative 11/25/2020 1635   LEUKOCYTESUR LARGE (A) 10/01/2020 0619    Lab Results  Component Value Date   LABMICR See below: 11/25/2020   WBCUA None seen 11/25/2020   LABEPIT None seen 11/25/2020   MUCUS Present 10/21/2020   BACTERIA None seen 11/25/2020       Assessment & Plan:    1. BPH without obstruction/lower urinary tract symptoms Stable and PSA low   2. Microhematuria Benign eval    No follow-ups on file.  Festus Aloe, MD  [facility]  [address] [place], [place] [address] [phone]

## 2021-01-02 ENCOUNTER — Other Ambulatory Visit: Payer: Self-pay | Admitting: Internal Medicine

## 2021-01-02 DIAGNOSIS — I10 Essential (primary) hypertension: Secondary | ICD-10-CM

## 2021-01-10 ENCOUNTER — Other Ambulatory Visit: Payer: Self-pay

## 2021-01-10 ENCOUNTER — Ambulatory Visit (INDEPENDENT_AMBULATORY_CARE_PROVIDER_SITE_OTHER): Payer: 59 | Admitting: Internal Medicine

## 2021-01-10 ENCOUNTER — Encounter: Payer: Self-pay | Admitting: Internal Medicine

## 2021-01-10 VITALS — BP 138/78 | HR 83 | Resp 18 | Ht 72.0 in | Wt 234.1 lb

## 2021-01-10 DIAGNOSIS — I10 Essential (primary) hypertension: Secondary | ICD-10-CM

## 2021-01-10 DIAGNOSIS — Z0001 Encounter for general adult medical examination with abnormal findings: Secondary | ICD-10-CM

## 2021-01-10 DIAGNOSIS — Z114 Encounter for screening for human immunodeficiency virus [HIV]: Secondary | ICD-10-CM

## 2021-01-10 DIAGNOSIS — N529 Male erectile dysfunction, unspecified: Secondary | ICD-10-CM

## 2021-01-10 DIAGNOSIS — Z23 Encounter for immunization: Secondary | ICD-10-CM | POA: Diagnosis not present

## 2021-01-10 DIAGNOSIS — E559 Vitamin D deficiency, unspecified: Secondary | ICD-10-CM

## 2021-01-10 DIAGNOSIS — Z1211 Encounter for screening for malignant neoplasm of colon: Secondary | ICD-10-CM

## 2021-01-10 DIAGNOSIS — R2689 Other abnormalities of gait and mobility: Secondary | ICD-10-CM | POA: Insufficient documentation

## 2021-01-10 MED ORDER — LOSARTAN POTASSIUM 100 MG PO TABS: 100.0000 mg | ORAL_TABLET | Freq: Every day | ORAL | 1 refills | Status: DC

## 2021-01-10 MED ORDER — SILDENAFIL CITRATE 50 MG PO TABS: 50.0000 mg | ORAL_TABLET | Freq: Every day | ORAL | 2 refills | Status: AC | PRN

## 2021-01-10 NOTE — Progress Notes (Signed)
Established Patient Office Visit  Subjective:  Patient ID: Ryan Cain, male    DOB: 1955/06/28  Age: 65 y.o. MRN: [id number]  CC:  Chief Complaint  Patient presents with   Annual Exam    Annual exam     HPI Ryan Cain is a 65 y.o. male with past medical history of HTN, HLD, GAD and OA of knee who presents for annual physical.  HTN: BP is well-controlled now. Takes medications regularly. Patient denies headache, dizziness, chest pain, dyspnea or palpitations.  He complains of erectile dysfunction, for which he used to take Viagra in the past.  Denies any dysuria, hematuria, urethral discharge, urinary hesitance or resistance.  He complains of balance issues at times.  He admits that he needs to improve his hydration and also skips meals at times.  He eats about once in a day most of the days.  Denies any fatigue, excessive sweating, recent appetite or weight change.  He received PCV20 in the office today.   Past Medical History:  Diagnosis Date   Arthritis    Asthma    as a child   Hyperlipidemia    Hypertension    Situational mixed anxiety and depressive disorder    Situational mixed anxiety and depressive disorder     Past Surgical History:  Procedure Laterality Date   DENTAL SURGERY      Family History  Problem Relation Age of Onset   Arthritis Mother    Hypertension Mother    Hyperlipidemia Mother    Heart disease Father 62       heart attack   Heart disease Paternal Uncle     Social History   Socioeconomic History   Marital status: Married    Spouse name: Altha Harm   Number of children: 1   Years of education: 13   Highest education level: Not on file  Occupational History   Occupation: Librarian, 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   Social Determinants of Health   Financial Resource Strain: Not on file  Food Insecurity: Not on file  Transportation Needs: Not on file  Physical Activity: Not on file  Stress: Not on file  Social Connections: Not on file  Intimate Partner Violence: Not on file    Outpatient Medications Prior to Visit  Medication Sig Dispense Refill   Cholecalciferol (VITAMIN D3) 250 MCG (10000 UT) capsule Take 10,000 Units by mouth daily.     diphenhydrAMINE HCl (BENADRYL ALLERGY PO) Take 1 tablet by mouth daily.     DULoxetine (CYMBALTA) 60 MG capsule Take 1 capsule (60 mg total) by mouth daily. 90 capsule 1   fluticasone (FLONASE) 50 MCG/ACT nasal spray Place 2 sprays into both nostrils daily. 16 g 6   ibuprofen (ADVIL,MOTRIN) 200 MG tablet Take 200 mg by mouth every 8 (eight) hours as needed.  loratadine (CLARITIN) 10 MG tablet Take 10 mg by mouth daily.     Omega-3 Fatty Acids (FISH OIL) 1200 MG CAPS Take 2,400 mg by mouth.     losartan (COZAAR) 100 MG tablet TAKE 1 TABLET BY MOUTH DAILY 30 tablet 0   No facility-administered medications prior to visit.    No Known Allergies  ROS Review of Systems  Constitutional:  Negative for chills and fever.  HENT:  Negative for congestion and sore throat.   Eyes:  Negative for pain and discharge.  Respiratory:  Negative for cough and shortness of breath.   Cardiovascular:  Negative for chest pain and palpitations.  Gastrointestinal:  Negative for constipation, diarrhea, nausea and vomiting.  Endocrine: Negative for polydipsia and polyuria.  Genitourinary:  Negative for dysuria and hematuria.   Musculoskeletal:  Positive for arthralgias (B/l knee). Negative for neck pain and neck stiffness.  Skin:  Negative for rash.  Neurological:  Negative for dizziness, weakness, numbness and headaches.  Psychiatric/Behavioral:  Negative for agitation and behavioral problems.      Objective:    Physical Exam Vitals reviewed.  Constitutional:      General: He is not in acute distress.    Appearance: He is not diaphoretic.  HENT:     Head: Normocephalic and atraumatic.     Nose: Nose normal.     Mouth/Throat:     Mouth: Mucous membranes are moist.  Eyes:     General: No scleral icterus.    Extraocular Movements: Extraocular movements intact.  Cardiovascular:     Rate and Rhythm: Normal rate and regular rhythm.     Pulses: Normal pulses.     Heart sounds: Normal heart sounds. No murmur heard. Pulmonary:     Breath sounds: Normal breath sounds. No wheezing or rales.  Abdominal:     Palpations: Abdomen is soft.     Tenderness: There is no abdominal tenderness.  Musculoskeletal:     Cervical back: Neck supple. No tenderness.     Right lower leg: No edema.     Left lower leg: No edema.  Skin:    General: Skin is warm.     Findings: No rash.  Neurological:     General: No focal deficit present.     Mental Status: He is alert and oriented to person, place, and time.     Cranial Nerves: No cranial nerve deficit.     Sensory: No sensory deficit.     Motor: No weakness.  Psychiatric:        Mood and Affect: Mood normal.        Behavior: Behavior normal.    BP 138/78 (BP Location: Left Arm, Patient Position: Sitting, Cuff Size: Normal)    Pulse 83    Resp 18    Ht 6' (1.829 m)    Wt 234 lb 1.9 oz (106.2 kg)    SpO2 97%    BMI 31.75 kg/m  Wt Readings from Last 3 Encounters:  01/10/21 234 lb 1.9 oz (106.2 kg)  12/23/20 234 lb (106.1 kg)  11/27/20 234 lb (106.1 kg)    Lab Results  Component Value Date   TSH 1.60 11/14/2019   Lab Results  Component Value Date   WBC 17.6 (H)  10/01/2020   HGB 14.7 10/01/2020   HCT 42.1 10/01/2020   MCV 99.8 10/01/2020   PLT 166 10/01/2020   Lab Results  Component Value Date   NA 140 10/01/2020   K 4.1 10/01/2020   CO2  28 10/01/2020   GLUCOSE 97 10/01/2020   BUN 13 10/01/2020   CREATININE 1.19 10/01/2020   BILITOT 1.5 (H) 10/01/2020   ALKPHOS 54 10/01/2020   AST 18 10/01/2020   ALT 17 10/01/2020   PROT 7.2 10/01/2020   ALBUMIN 3.8 10/01/2020   CALCIUM 9.8 10/01/2020   ANIONGAP 10 10/01/2020   Lab Results  Component Value Date   CHOL 225 (H) 11/14/2019   Lab Results  Component Value Date   HDL 67 11/14/2019   Lab Results  Component Value Date   LDLCALC 142 (H) 11/14/2019   Lab Results  Component Value Date   TRIG 70 11/14/2019   Lab Results  Component Value Date   CHOLHDL 3.4 11/14/2019   Lab Results  Component Value Date   HGBA1C 5.0 11/14/2019      Assessment & Plan:   Problem List Items Addressed This Visit       Encounter for general adult medical examination with abnormal findings - Primary   Physical exam as documented. Counseling done  re healthy lifestyle involving commitment to 150 minutes exercise per week, heart healthy diet, and attaining healthy weight.The importance of adequate sleep also discussed. Changes in health habits are decided on by the patient with goals and time frames  set for achieving them. Immunization and cancer screening needs are specifically addressed at this visit.     Relevant Orders  TSH  Hemoglobin A1c  CMP14+EGFR  CBC with Differential/Platelet    Cardiovascular and Mediastinum   Essential hypertension    BP Readings from Last 1 Encounters:  01/10/21 138/78  Well-controlled now with losartan 100 mg QD Counseled for compliance with the medications Advised DASH diet and moderate exercise/walking, at least 150 mins/week       Relevant Medications   losartan (COZAAR) 100 MG tablet   sildenafil (VIAGRA) 50 MG tablet     Other   Erectile  dysfunction    Was on Viagra as needed in the past, started Viagra 50 mg as needed      Relevant Medications   sildenafil (VIAGRA) 50 MG tablet   Balance problem    Intermittent, could be related to electrolyte disturbance, needs to maintain adequate hydration and eat at regular intervals Does not have any neurologic deficit currently If persistent, will get imaging and/or Neurology eval      Other Visit Diagnoses     Colon cancer screening       Relevant Orders   Ambulatory referral to Gastroenterology   Vitamin D deficiency       Relevant Orders   VITAMIN D 25 Hydroxy (Vit-D Deficiency, Fractures)   Encounter for screening for HIV       Relevant Orders   HIV antibody (with reflex)   Need for pneumococcal vaccination       Relevant Orders   Pneumococcal conjugate vaccine 20-valent (Prevnar 20)       Meds ordered this encounter  Medications   losartan (COZAAR) 100 MG tablet    Sig: Take 1 tablet (100 mg total) by mouth daily.    Dispense:  90 tablet    Refill:  1   sildenafil (VIAGRA) 50 MG tablet    Sig: Take 1 tablet (50 mg total) by mouth daily as needed for erectile dysfunction.    Dispense:  30 tablet    Refill:  2    Follow-up: Return in about 6 months (around 07/11/2021) for HTN.    Lindell Spar, MD

## 2021-01-10 NOTE — Assessment & Plan Note (Signed)

## 2021-01-10 NOTE — Patient Instructions (Signed)
Please continue taking medications as prescribed.  Please continue to follow low salt diet and eat at regular intervals.  You were given PCV20 in the office today.  You are being referred to GI.

## 2021-01-10 NOTE — Assessment & Plan Note (Signed)
BP Readings from Last 1 Encounters:  01/10/21 138/78   Well-controlled now with losartan 100 mg QD Counseled for compliance with the medications Advised DASH diet and moderate exercise/walking, at least 150 mins/week

## 2021-01-10 NOTE — Assessment & Plan Note (Signed)
Was on Viagra as needed in the past, started Viagra 50 mg as needed

## 2021-01-10 NOTE — Assessment & Plan Note (Signed)
Intermittent, could be related to electrolyte disturbance, needs to maintain adequate hydration and eat at regular intervals Does not have any neurologic deficit currently If persistent, will get imaging and/or Neurology eval

## 2021-01-11 LAB — CMP14+EGFR
ALT: 21 IU/L (ref 0–44)
AST: 31 IU/L (ref 0–40)
Albumin/Globulin Ratio: 1.9 (ref 1.2–2.2)
Albumin: 4.3 g/dL (ref 3.8–4.8)
Alkaline Phosphatase: 63 IU/L (ref 44–121)
BUN/Creatinine Ratio: 12 (ref 10–24)
BUN: 12 mg/dL (ref 8–27)
Bilirubin Total: 0.4 mg/dL (ref 0.0–1.2)
CO2: 24 mmol/L (ref 20–29)
Calcium: 9.5 mg/dL (ref 8.6–10.2)
Chloride: 107 mmol/L — ABNORMAL HIGH (ref 96–106)
Creatinine, Ser: 1.01 mg/dL (ref 0.76–1.27)
Globulin, Total: 2.3 g/dL (ref 1.5–4.5)
Glucose: 88 mg/dL (ref 70–99)
Potassium: 4.2 mmol/L (ref 3.5–5.2)
Sodium: 144 mmol/L (ref 134–144)
Total Protein: 6.6 g/dL (ref 6.0–8.5)
eGFR: 83 mL/min/{1.73_m2} (ref >59)

## 2021-01-11 LAB — CBC WITH DIFFERENTIAL/PLATELET
Basophils Absolute: 0 10*3/uL (ref 0.0–0.2)
Basos: 1 %
EOS (ABSOLUTE): 0.1 10*3/uL (ref 0.0–0.4)
Eos: 3 %
Hematocrit: 41 % (ref 37.5–51.0)
Hemoglobin: 14.2 g/dL (ref 13.0–17.7)
Immature Grans (Abs): 0 10*3/uL (ref 0.0–0.1)
Immature Granulocytes: 0 %
Lymphocytes Absolute: 1.3 10*3/uL (ref 0.7–3.1)
Lymphs: 31 %
MCH: 32.8 pg (ref 26.6–33.0)
MCHC: 34.6 g/dL (ref 31.5–35.7)
MCV: 95 fL (ref 79–97)
Monocytes Absolute: 0.5 10*3/uL (ref 0.1–0.9)
Monocytes: 12 %
Neutrophils Absolute: 2.2 10*3/uL (ref 1.4–7.0)
Neutrophils: 53 %
Platelets: 184 10*3/uL (ref 150–450)
RBC: 4.33 x10E6/uL (ref 4.14–5.80)
RDW: 12.2 % (ref 11.6–15.4)
WBC: 4.2 10*3/uL (ref 3.4–10.8)

## 2021-01-11 LAB — VITAMIN D 25 HYDROXY (VIT D DEFICIENCY, FRACTURES): Vit D, 25-Hydroxy: 74.6 ng/mL (ref 30.0–100.0)

## 2021-01-11 LAB — TSH: TSH: 0.4 u[IU]/mL — ABNORMAL LOW (ref 0.450–4.500)

## 2021-01-11 LAB — HEMOGLOBIN A1C
Est. average glucose Bld gHb Est-mCnc: 100 mg/dL
Hgb A1c MFr Bld: 5.1 % (ref 4.8–5.6)

## 2021-01-11 LAB — HIV ANTIBODY (ROUTINE TESTING W REFLEX): HIV Screen 4th Generation wRfx: NONREACTIVE

## 2021-01-17 ENCOUNTER — Encounter: Payer: Self-pay | Admitting: *Deleted

## 2021-01-20 ENCOUNTER — Telehealth: Payer: Self-pay

## 2021-01-20 NOTE — Telephone Encounter (Signed)
Wife Altha Harm on [place] advised of lab results with verbal understanding

## 2021-01-20 NOTE — Telephone Encounter (Signed)
Pt is calling again and asked me to send you another message.  He thinks you are trying to reach him regarding lab work.  Please call when you can.  Thanks

## 2021-01-27 ENCOUNTER — Encounter: Payer: Self-pay | Admitting: Internal Medicine

## 2021-03-10 ENCOUNTER — Other Ambulatory Visit: Payer: Self-pay | Admitting: Internal Medicine

## 2021-03-10 DIAGNOSIS — F411 Generalized anxiety disorder: Secondary | ICD-10-CM

## 2021-03-17 ENCOUNTER — Other Ambulatory Visit: Payer: Self-pay

## 2021-03-17 ENCOUNTER — Ambulatory Visit (INDEPENDENT_AMBULATORY_CARE_PROVIDER_SITE_OTHER): Payer: Self-pay | Admitting: *Deleted

## 2021-03-17 VITALS — Ht 72.0 in | Wt 245.0 lb

## 2021-03-17 DIAGNOSIS — Z1211 Encounter for screening for malignant neoplasm of colon: Secondary | ICD-10-CM

## 2021-03-17 NOTE — Progress Notes (Addendum)
Gastroenterology Pre-Procedure Review  Request Date: 03/17/2021 Requesting Physician: Dr. Ihor Dow @ [place], Last TCS done 5 or more years ago in [place] per pt, no polyps per pt, no record of this in [place]: The patient responded to the following health history questions as indicated:    1. Diabetes Melitis: no 2. Joint replacements in the past 12 months: no 3. Major health problems in the past 3 months: no 4. Has an artificial valve or MVP: no 5. Has a defibrillator: no 6. Has been advised in past to take antibiotics in advance of a procedure like teeth cleaning: no 7. Family history of colon cancer: no  8. Alcohol Use: yes, 2 pints a week 9. Illicit drug Use: no 10. History of sleep apnea: no  11. History of coronary artery or other vascular stents placed within the last 12 months: no 12. History of any prior anesthesia complications: no 13. Body mass index is 33.23 kg/m.    MEDICATIONS & ALLERGIES:    Patient reports the following regarding taking any blood thinners:   Plavix? no Aspirin? no Coumadin? no Brilinta? no Xarelto? no Eliquis? no Pradaxa? no Savaysa? no Effient? no  Patient confirms/reports the following medications:  Current Outpatient Medications  Medication Sig Dispense Refill   Cholecalciferol (VITAMIN D3) 250 MCG (10000 UT) capsule Take 10,000 Units by mouth daily.     diphenhydrAMINE HCl (BENADRYL ALLERGY PO) Take 1 tablet by mouth as needed.     DULoxetine (CYMBALTA) 60 MG capsule TAKE (1) CAPSULE BY MOUTH ONCE A DAY. 90 capsule 0   fluticasone (FLONASE) 50 MCG/ACT nasal spray Place 2 sprays into both nostrils daily. 16 g 6   ibuprofen (ADVIL,MOTRIN) 200 MG tablet Take 200 mg by mouth as needed.     losartan (COZAAR) 100 MG tablet Take 1 tablet (100 mg total) by mouth daily. 90 tablet 1   Omega-3 Fatty Acids (FISH OIL) 1200 MG CAPS Take 2,400 mg by mouth daily at 6 (six) AM.     sildenafil (VIAGRA) 50 MG  tablet Take 1 tablet (50 mg total) by mouth daily as needed for erectile dysfunction. 30 tablet 2   No current facility-administered medications for this visit.    Patient confirms/reports the following allergies:  No Known Allergies  No orders of the defined types were placed in this encounter.   AUTHORIZATION INFORMATION Primary Insurance: [employer],  ID #: ,  Group #:  Pre-Cert / Auth required: No, not required per Jama Flavors Pre-Cert / Auth #: REF: Jama Flavors 03/27/2021 2:55 [place] Standard Time  SCHEDULE INFORMATION: Procedure has been scheduled as follows:  Date: 04/18/2021, Time: 1:15 Location:   This Gastroenterology Pre-Precedure Review Form is being routed to the following provider(s): Roseanne Kaufman, NP

## 2021-03-24 NOTE — Progress Notes (Signed)
ASA 3 due to alcohol use. Does this mean he needs to be brought in?

## 2021-03-25 NOTE — Progress Notes (Signed)
I think he's ok to proceed with procedure if it's only due to alcohol use unless you or Dr. Abbey Chatters specify otherwise.

## 2021-03-26 ENCOUNTER — Encounter: Payer: Self-pay | Admitting: *Deleted

## 2021-03-26 MED ORDER — PEG 3350-KCL-NA BICARB-NACL 420 G PO SOLR
4000.0000 mL | Freq: Once | ORAL | 0 refills | Status: AC
Start: 2021-03-26 — End: 2021-03-26

## 2021-03-26 NOTE — Progress Notes (Addendum)
Spoke to pt. Scheduled procedure for 04/18/2021.  Pt made aware that he will need Pre-op visit.  He was informed that his procedure time will be given at his Pre-op appointment.  Reviewed prep instructions with pt by phone.  Pt is coming by office to drop off a copy of insurance card.  Will pick up prep instructions along with Pre-op letter then.  Pre-op 04/16/2021 at 2:15 at [facility].  Pt aware.

## 2021-03-26 NOTE — Progress Notes (Signed)
Noted. May proceed with Abbey Chatters, ASA 3

## 2021-03-26 NOTE — Addendum Note (Signed)
Addended by: Metro Kung on: 03/26/2021 12:02 PM   Modules accepted: Orders

## 2021-03-28 ENCOUNTER — Other Ambulatory Visit: Payer: Self-pay | Admitting: *Deleted

## 2021-04-15 NOTE — Patient Instructions (Signed)
?   Your procedure is scheduled on: 04/18/2021 ? Report to [facility] at    10:45 AM. ? Call this number if you have problems the morning of surgery: [phone] ? ? Remember: ? ?            Follow Directions on the letter you received from Your Physician's office regarding the Bowel Prep ? ?            No Smoking the day of Procedure : ? ? Take these medicines the morning of surgery with A SIP OF WATER: Cymbalta, Claritin, and Flonase if needed ? ? Do not wear jewelry, make-up or nail polish. ?  ? Do not bring valuables to the hospital. ? Contacts, dentures or bridgework may not be worn into surgery. ? . ? ? Patients discharged the day of surgery will not be allowed to drive home. ?  ?  ?Colonoscopy, Adult, Care After ?This sheet gives you information about how to care for yourself after your procedure. Your health care provider may also give you more specific instructions. If you have problems or questions, contact your health care provider. ?What can I expect after the procedure? ?After the procedure, it is common to have: ?A small amount of blood in your stool for 24 hours after the procedure. ?Some gas. ?Mild abdominal cramping or bloating. ? ?Follow these instructions at home: ?General instructions ? ?For the first 24 hours after the procedure: ?Do not drive or use machinery. ?Do not sign important documents. ?Do not drink alcohol. ?Do your regular daily activities at a slower pace than normal. ?Eat soft, easy-to-digest foods. ?Rest often. ?Take over-the-counter or prescription medicines only as told by your health care provider. ?It is up to you to get the results of your procedure. Ask your health care provider, or the department performing the procedure, when your results will be ready. ?Relieving cramping and bloating ?Try walking around when you have cramps or feel bloated. ?Apply heat to your abdomen as told by your health care provider. Use a heat source that your health care provider  recommends, such as a moist heat pack or a heating pad. ?Place a towel between your skin and the heat source. ?Leave the heat on for 20-30 minutes. ?Remove the heat if your skin turns bright red. This is especially important if you are unable to feel pain, heat, or cold. You may have a greater risk of getting burned. ?Eating and drinking ?Drink enough fluid to keep your urine clear or pale yellow. ?Resume your normal diet as instructed by your health care provider. Avoid heavy or fried foods that are hard to digest. ?Avoid drinking alcohol for as long as instructed by your health care provider. ?Contact a health care provider if: ?You have blood in your stool 2-3 days after the procedure. ?Get help right away if: ?You have more than a small spotting of blood in your stool. ?You pass large blood clots in your stool. ?Your abdomen is swollen. ?You have nausea or vomiting. ?You have a fever. ?You have increasing abdominal pain that is not relieved with medicine. ?This information is not intended to replace advice given to you by your health care provider. Make sure you discuss any questions you have with your health care provider. ?Document Released: 08/20/2003 Document Revised: 09/30/2015 Document Reviewed: 03/19/2015 ?Elsevier Interactive Patient Education ? 2018 [place].  ?

## 2021-04-16 ENCOUNTER — Encounter (HOSPITAL_COMMUNITY): Payer: Self-pay

## 2021-04-16 ENCOUNTER — Telehealth: Payer: Self-pay | Admitting: *Deleted

## 2021-04-16 ENCOUNTER — Encounter (HOSPITAL_COMMUNITY)
Admission: RE | Admit: 2021-04-16 | Discharge: 2021-04-16 | Disposition: A | Discharge status: Home / Self Care | Payer: 59 | Source: Ambulatory Visit | Attending: Internal Medicine | Admitting: Internal Medicine

## 2021-04-16 NOTE — Telephone Encounter (Signed)
Tried to call pt on both numbers listed in Epic.  Left a voice mail for pt on both numbers. ?

## 2021-04-16 NOTE — Telephone Encounter (Signed)
-----   Message from Jacqulynn Cadet, RN sent at 04/16/2021  2:47 PM EDT ----- ?Regarding: No Show ?Ryan Cain did not show up for his PAT appointment today ? ?

## 2021-04-17 NOTE — Telephone Encounter (Signed)
Patient called in. Was told by endo unable to get in for pre-op appt today. His procedure will need to be rescheduled for tomorrow ?

## 2021-04-17 NOTE — Telephone Encounter (Signed)
Pt came by office yesterday late afternoon.  Pt was given phone number to call Day Surgery to see if they can arrange another Pre-op before his procedure.   ?

## 2021-04-17 NOTE — Telephone Encounter (Signed)
Lmom for pt to call me back. 

## 2021-04-24 ENCOUNTER — Encounter: Payer: Self-pay | Admitting: *Deleted

## 2021-04-24 NOTE — Telephone Encounter (Signed)
Spoke to pt.  He is aware of Pre-op appointment on 05/28/2021 at 9:00 at [facility]. ?

## 2021-04-24 NOTE — Telephone Encounter (Addendum)
Spoke to pt.  Rescheduled procedure to 05/30/2021.  He is aware that he needs to go for Pre-op appointment prior to this.  Informed him that I would call him back with new Pre-op appointment date.  He is aware that I will mail out instructions as well.  Called and made [facility] aware of new procedure date. ?

## 2021-05-27 NOTE — Patient Instructions (Signed)
? ? ? ? ? ? Ryan Cain ? 05/27/2021  ?  ? '@PREFPERIOPPHARMACY'$ @ ? ? Your procedure is scheduled on  05/30/2021. ? ? Report to [name] at  [facility]. ? ? Call this number if you have problems the morning of surgery: ? [phone] ? ? Remember: ? Follow the diet and prep instructions given to you by the office. ?  ? Take these medicines the morning of surgery with A SIP OF WATER  ? ?                                 cymbalta, claritin. ?  ? ? Do not wear jewelry, make-up or nail polish. ? Do not wear lotions, powders, or perfumes, or deodorant. ? Do not shave 48 hours prior to surgery.  Men may shave face and neck. ? Do not bring valuables to the hospital. ?  is not responsible for any belongings or valuables. ? ?Contacts, dentures or bridgework may not be worn into surgery.  Leave your suitcase in the car.  After surgery it may be brought to your room. ? ?For patients admitted to the hospital, discharge time will be determined by your treatment team. ? ?Patients discharged the day of surgery will not be allowed to drive home and must have someone with them for 24 hours.  ? ? ?Special instructions:   DO NOT smoke tobacco or vape for 24 hours before your procedure. ? ?Please read over the following fact sheets that you were given. ?Anesthesia Post-op Instructions and Care and Recovery After Surgery ?  ? ? ? Colonoscopy, Adult, Care After ?The following information offers guidance on how to care for yourself after your procedure. Your health care provider may also give you more specific instructions. If you have problems or questions, contact your health care provider. ?What can I expect after the procedure? ?After the procedure, it is common to have: ?A small amount of blood in your stool for 24 hours after the procedure. ?Some gas. ?Mild cramping or bloating of your abdomen. ?Follow these instructions at home: ?Eating and drinking ? ?Drink enough fluid to keep your urine pale yellow. ?Follow  instructions from your health care provider about eating or drinking restrictions. ?Resume your normal diet as told by your health care provider. Avoid heavy or fried foods that are hard to digest. ?Activity ?Rest as told by your health care provider. ?Avoid sitting for a long time without moving. Get up to take short walks every 1-2 hours. This is important to improve blood flow and breathing. Ask for help if you feel weak or unsteady. ?Return to your normal activities as told by your health care provider. Ask your health care provider what activities are safe for you. ?Managing cramping and bloating ? ?Try walking around when you have cramps or feel bloated. ?If directed, apply heat to your abdomen as told by your health care provider. Use the heat source that your health care provider recommends, such as a moist heat pack or a heating pad. ?Place a towel between your skin and the heat source. ?Leave the heat on for 20-30 minutes. ?Remove the heat if your skin turns bright red. This is especially important if you are unable to feel pain, heat, or cold. You have a greater risk of getting burned. ?General instructions ?If you were given a sedative during the procedure, it can affect you for several hours.  Do not drive or operate machinery until your health care provider says that it is safe. ?For the first 24 hours after the procedure: ?Do not sign important documents. ?Do not drink alcohol. ?Do your regular daily activities at a slower pace than normal. ?Eat soft foods that are easy to digest. ?Take over-the-counter and prescription medicines only as told by your health care provider. ?Keep all follow-up visits. This is important. ?Contact a health care provider if: ?You have blood in your stool 2-3 days after the procedure. ?Get help right away if: ?You have more than a small spotting of blood in your stool. ?You have large blood clots in your stool. ?You have swelling of your abdomen. ?You have nausea or  vomiting. ?You have a fever. ?You have increasing pain in your abdomen that is not relieved with medicine. ?These symptoms may be an emergency. Get help right away. Call 911. ?Do not wait to see if the symptoms will go away. ?Do not drive yourself to the hospital. ?Summary ?After the procedure, it is common to have a small amount of blood in your stool. You may also have mild cramping and bloating of your abdomen. ?If you were given a sedative during the procedure, it can affect you for several hours. Do not drive or operate machinery until your health care provider says that it is safe. ?Get help right away if you have a lot of blood in your stool, nausea or vomiting, a fever, or increased pain in your abdomen. ?This information is not intended to replace advice given to you by your health care provider. Make sure you discuss any questions you have with your health care provider. ?Document Revised: 08/28/2020 Document Reviewed: 08/28/2020 ?Elsevier Patient Education ? [place]. ?Monitored Anesthesia Care, Care After ?This sheet gives you information about how to care for yourself after your procedure. Your health care provider may also give you more specific instructions. If you have problems or questions, contact your health care provider. ?What can I expect after the procedure? ?After the procedure, it is common to have: ?Tiredness. ?Forgetfulness about what happened after the procedure. ?Impaired judgment for important decisions. ?Nausea or vomiting. ?Some difficulty with balance. ?Follow these instructions at home: ?For the time period you were told by your health care provider: ? ?  ? ?Rest as needed. ?Do not participate in activities where you could fall or become injured. ?Do not drive or use machinery. ?Do not drink alcohol. ?Do not take sleeping pills or medicines that cause drowsiness. ?Do not make important decisions or sign legal documents. ?Do not take care of children on your own. ?Eating and  drinking ?Follow the diet that is recommended by your health care provider. ?Drink enough fluid to keep your urine pale yellow. ?If you vomit: ?Drink water, juice, or soup when you can drink without vomiting. ?Make sure you have little or no nausea before eating solid foods. ?General instructions ?Have a responsible adult stay with you for the time you are told. It is important to have someone help care for you until you are awake and alert. ?Take over-the-counter and prescription medicines only as told by your health care provider. ?If you have sleep apnea, surgery and certain medicines can increase your risk for breathing problems. Follow instructions from your health care provider about wearing your sleep device: ?Anytime you are sleeping, including during daytime naps. ?While taking prescription pain medicines, sleeping medicines, or medicines that make you drowsy. ?Avoid smoking. ?Keep all follow-up visits as  told by your health care provider. This is important. ?Contact a health care provider if: ?You keep feeling nauseous or you keep vomiting. ?You feel light-headed. ?You are still sleepy or having trouble with balance after 24 hours. ?You develop a rash. ?You have a fever. ?You have redness or swelling around the IV site. ?Get help right away if: ?You have trouble breathing. ?You have new-onset confusion at home. ?Summary ?For several hours after your procedure, you may feel tired. You may also be forgetful and have poor judgment. ?Have a responsible adult stay with you for the time you are told. It is important to have someone help care for you until you are awake and alert. ?Rest as told. Do not drive or operate machinery. Do not drink alcohol or take sleeping pills. ?Get help right away if you have trouble breathing, or if you suddenly become confused. ?This information is not intended to replace advice given to you by your health care provider. Make sure you discuss any questions you have with your  health care provider. ?Document Revised: 12/10/2020 Document Reviewed: 12/08/2018 ?Elsevier Patient Education ? [place]. ? ?

## 2021-05-28 ENCOUNTER — Encounter (HOSPITAL_COMMUNITY): Payer: Self-pay

## 2021-05-28 ENCOUNTER — Encounter (HOSPITAL_COMMUNITY)
Admission: RE | Admit: 2021-05-28 | Discharge: 2021-05-28 | Disposition: A | Discharge status: Home / Self Care | Payer: 59 | Source: Ambulatory Visit | Attending: Internal Medicine | Admitting: Internal Medicine

## 2021-05-28 ENCOUNTER — Encounter (HOSPITAL_COMMUNITY): Payer: 59

## 2021-05-28 DIAGNOSIS — Z01810 Encounter for preprocedural cardiovascular examination: Secondary | ICD-10-CM | POA: Insufficient documentation

## 2021-05-30 ENCOUNTER — Ambulatory Visit (HOSPITAL_BASED_OUTPATIENT_CLINIC_OR_DEPARTMENT_OTHER): Payer: 59 | Admitting: Anesthesiology

## 2021-05-30 ENCOUNTER — Encounter (HOSPITAL_COMMUNITY): Payer: Self-pay

## 2021-05-30 ENCOUNTER — Ambulatory Visit (HOSPITAL_COMMUNITY)
Admission: RE | Admit: 2021-05-30 | Discharge: 2021-05-30 | Disposition: A | Discharge status: Home / Self Care | Payer: 59 | Attending: Internal Medicine | Admitting: Internal Medicine

## 2021-05-30 ENCOUNTER — Ambulatory Visit (HOSPITAL_COMMUNITY): Payer: 59 | Admitting: Anesthesiology

## 2021-05-30 ENCOUNTER — Encounter (HOSPITAL_COMMUNITY)
Admission: RE | Disposition: A | Discharge status: Home / Self Care | Payer: Self-pay | Source: Home / Self Care | Attending: Internal Medicine

## 2021-05-30 DIAGNOSIS — Z1211 Encounter for screening for malignant neoplasm of colon: Secondary | ICD-10-CM

## 2021-05-30 DIAGNOSIS — I10 Essential (primary) hypertension: Secondary | ICD-10-CM | POA: Insufficient documentation

## 2021-05-30 DIAGNOSIS — F419 Anxiety disorder, unspecified: Secondary | ICD-10-CM | POA: Insufficient documentation

## 2021-05-30 DIAGNOSIS — F32A Depression, unspecified: Secondary | ICD-10-CM | POA: Diagnosis not present

## 2021-05-30 DIAGNOSIS — K635 Polyp of colon: Secondary | ICD-10-CM

## 2021-05-30 DIAGNOSIS — D120 Benign neoplasm of cecum: Secondary | ICD-10-CM | POA: Diagnosis not present

## 2021-05-30 HISTORY — PX: POLYPECTOMY: SHX5525

## 2021-05-30 HISTORY — PX: COLONOSCOPY WITH PROPOFOL: SHX5780

## 2021-05-30 SURGERY — COLONOSCOPY WITH PROPOFOL
Anesthesia: General

## 2021-05-30 MED ORDER — PROPOFOL 10 MG/ML IV BOLUS
INTRAVENOUS | Status: DC | PRN
  Administered 2021-05-30: 100 mg via INTRAVENOUS

## 2021-05-30 MED ORDER — LACTATED RINGERS IV SOLN: INTRAVENOUS | Status: DC | PRN

## 2021-05-30 MED ORDER — LIDOCAINE HCL (CARDIAC) PF 100 MG/5ML IV SOSY
PREFILLED_SYRINGE | INTRAVENOUS | Status: DC | PRN
  Administered 2021-05-30: 60 mg via INTRATRACHEAL

## 2021-05-30 MED ORDER — PROPOFOL 500 MG/50ML IV EMUL
INTRAVENOUS | Status: DC | PRN
  Administered 2021-05-30: 200 ug/kg/min via INTRAVENOUS

## 2021-05-30 NOTE — Anesthesia Postprocedure Evaluation (Signed)
Anesthesia Post Note ? ?Patient: Ryan Cain ? ?Procedure(s) Performed: COLONOSCOPY WITH PROPOFOL ?POLYPECTOMY ? ?Patient location during evaluation: Phase II ?Anesthesia Type: General ?Level of consciousness: awake ?Pain management: pain level controlled ?Vital Signs Assessment: post-procedure vital signs reviewed and stable ?Respiratory status: spontaneous breathing and respiratory function stable ?Cardiovascular status: blood pressure returned to baseline and stable ?Postop Assessment: no headache and no apparent nausea or vomiting ?Anesthetic complications: no ?Comments: Late entry ? ? ?No notable events documented. ? ? ?Last Vitals:  ?Vitals:  ? 05/30/21 0859 05/30/21 0958  ?BP: (!) 180/87 140/65  ?Pulse: 65 65  ?Resp: 18 18  ?Temp: 36.6 ?C 36.7 ?C  ?SpO2: 97% 98%  ?  ?Last Pain:  ?Vitals:  ? 05/30/21 0958  ?TempSrc: Oral  ?PainSc: 0-No pain  ? ? ?  ?  ?  ?  ?  ?  ? ?Louann Sjogren ? ? ? ? ?

## 2021-05-30 NOTE — Transfer of Care (Signed)
Immediate Anesthesia Transfer of Care Note ? ?Patient: Ryan Cain ? ?Procedure(s) Performed: COLONOSCOPY WITH PROPOFOL ?POLYPECTOMY ? ?Patient Location: Short Stay ? ?Anesthesia Type:General ? ?Level of Consciousness: sedated ? ?Airway & Oxygen Therapy: Patient Spontanous Breathing ? ?Post-op Assessment: Report given to RN and Post -op Vital signs reviewed and stable ? ?Post vital signs: Reviewed and stable ? ?Last Vitals:  ?Vitals Value Taken Time  ?BP    ?Temp    ?Pulse    ?Resp    ?SpO2    ? ? ?Last Pain:  ?Vitals:  ? 05/30/21 0939  ?TempSrc:   ?PainSc: 0-No pain  ?   ? ?  ? ?Complications: No notable events documented. ?

## 2021-05-30 NOTE — H&P (Signed)
Primary Care Physician:  Lindell Spar, MD ?Primary Gastroenterologist:  Dr. Abbey Chatters ? ?Pre-Procedure History & Physical: ?HPI:  Ryan Cain is a 66 y.o. male is here for a colonoscopy for colon cancer screening purposes.  Patient denies any family history of colorectal cancer.  No melena or hematochezia.  No abdominal pain or unintentional weight loss.  No change in bowel habits.  Overall feels well from a GI standpoint. ? ?Past Medical History:  ?Diagnosis Date  ? Arthritis   ? Asthma   ? as a child  ? Hyperlipidemia   ? Hypertension   ? Situational mixed anxiety and depressive disorder   ? Situational mixed anxiety and depressive disorder   ? ? ?Past Surgical History:  ?Procedure Laterality Date  ? DENTAL SURGERY    ? ? ?Prior to Admission medications   ?Medication Sig Start Date End Date Taking? Authorizing Provider  ?acetaminophen (TYLENOL) 650 MG CR tablet Take 650-1,300 mg by mouth every 8 (eight) hours as needed for pain.   Yes [provider]  ?Cholecalciferol (VITAMIN D3) 125 MCG (5000 UT) CAPS Take 5,000 Units by mouth daily.   Yes [provider]  ?DULoxetine (CYMBALTA) 60 MG capsule TAKE (1) CAPSULE BY MOUTH ONCE A DAY. 03/10/21  Yes Lindell Spar, MD  ?fluticasone (FLONASE) 50 MCG/ACT nasal spray Place 2 sprays into both nostrils daily. 07/16/20  Yes Gosrani, Nimish C, MD  ?GAVILYTE-G 236 g solution Take 4,000 mLs by mouth as directed. 03/26/21  Yes [provider]  ?loratadine (CLARITIN) 10 MG tablet Take 10 mg by mouth daily.   Yes [provider]  ?losartan (COZAAR) 100 MG tablet Take 1 tablet (100 mg total) by mouth daily. 01/10/21  Yes Lindell Spar, MD  ?Multiple Vitamins-Minerals (MULTIVITAMIN WITH MINERALS) tablet Take 1 tablet by mouth daily.   Yes [provider]  ?Omega-3 Fatty Acids (FISH OIL PO) Take 2,000 mg by mouth in the morning and at bedtime.   Yes [provider]  ?OVER THE COUNTER MEDICATION Take 2 capsules by mouth daily.  Beet Root   Yes [provider]  ?sildenafil (VIAGRA) 50 MG tablet Take 1 tablet (50 mg total) by mouth daily as needed for erectile dysfunction. 01/10/21  Yes Lindell Spar, MD  ? ? ?Allergies as of 03/26/2021  ? (No Known Allergies)  ? ? ?Family History  ?Problem Relation Age of Onset  ? Arthritis Mother   ? Hypertension Mother   ? Hyperlipidemia Mother   ? Heart disease Father 74  ?     heart attack  ? Heart disease Paternal Uncle   ? ? ?Social History  ? ?Socioeconomic History  ? Marital status: Married  ?  Spouse name: Altha Harm  ? Number of children: 1  ? Years of education: 64  ? Highest education level: Not on file  ?Occupational History  ? Occupation: Librarian, academic  ?  Comment: [employer]- plant closed  ?Tobacco Use  ? Smoking status: Never  ? Smokeless tobacco: Never  ?Vaping Use  ? Vaping Use: Never used  ?Substance and Sexual Activity  ? Alcohol use: Yes  ?  Alcohol/week: 6.0 standard drinks  ?  Types: 6 Shots of liquor per week  ?  Comment: 3x a week bourbon  ? Drug use: No  ? Sexual activity: Yes  ?  Birth control/protection: Post-menopausal  ?Other Topics Concern  ? Not on file  ?Social History Narrative  ? Lives with wife Altha Harm who has spina bifida-  affected mildly.Married over 40 years.  ? 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  ? ?Social Determinants of Health  ? ?Financial Resource Strain: Not on file  ?Food Insecurity: Not on file  ?Transportation Needs: Not on file  ?Physical Activity: Not on file  ?Stress: Not on file  ?Social Connections: Not on file  ?Intimate Partner Violence: Not on file  ? ? ?Review of Systems: ?See HPI, otherwise negative ROS ? ?Physical Exam: ?Vital signs in last 24 hours: ?Temp:  [97.8 ?F (36.6 ?C)] 97.8 ?F (36.6 ?C) (05/12 0859) ?Pulse Rate:  [65] 65 (05/12 0859) ?Resp:  [18] 18 (05/12 0859) ?BP: (180)/(87) 180/87 (05/12 0859) ?SpO2:  [97 %] 97 % (05/12 0859) ?Weight:  [111.1 kg] 111.1 kg (05/12 0859) ?  ?General:   Alert,  Well-developed,  well-nourished, pleasant and cooperative in NAD ?Head:  Normocephalic and atraumatic. ?Eyes:  Sclera clear, no icterus.   Conjunctiva pink. ?Ears:  Normal auditory acuity. ?Nose:  No deformity, discharge,  or lesions. ?Mouth:  No deformity or lesions, dentition normal. ?Neck:  Supple; no masses or thyromegaly. ?Lungs:  Clear throughout to auscultation.   No wheezes, crackles, or rhonchi. No acute distress. ?Heart:  Regular rate and rhythm; no murmurs, clicks, rubs,  or gallops. ?Abdomen:  Soft, nontender and nondistended. No masses, hepatosplenomegaly or hernias noted. Normal bowel sounds, without guarding, and without rebound.   ?Msk:  Symmetrical without gross deformities. Normal posture. ?Extremities:  Without clubbing or edema. ?Neurologic:  Alert and  oriented x4;  grossly normal neurologically. ?Skin:  Intact without significant lesions or rashes. ?Cervical Nodes:  No significant cervical adenopathy. ?Psych:  Alert and cooperative. Normal mood and affect. ? ?Impression/Plan: ?Ryan Cain is here for a colonoscopy to be performed for colon cancer screening purposes. ? ?The risks of the procedure including infection, bleed, or perforation as well as benefits, limitations, alternatives and imponderables have been reviewed with the patient. Questions have been answered. All parties agreeable. ? ?

## 2021-05-30 NOTE — Op Note (Signed)
[facility] ?Patient Name: Ryan Cain ?Procedure Date: 05/30/2021 9:36 AM ?MRN: [id number] ?Date of Birth: May 29, 1955 ?Attending MD: Elon Alas. Abbey Chatters , DO ?CSN: [id number] ?Age: 66 ?Admit Type: Outpatient ?Procedure:                Colonoscopy ?Indications:              Screening for colorectal malignant neoplasm ?Providers:                Elon Alas. Abbey Chatters, DO, Caprice Kluver, Raphael Gibney,  ?                          Technician ?Referring MD:              ?Medicines:                See the Anesthesia note for documentation of the  ?                          administered medications ?Complications:            No immediate complications. ?Estimated Blood Loss:     Estimated blood loss was minimal. ?Procedure:                Pre-Anesthesia Assessment: ?                          - The anesthesia plan was to use monitored  ?                          anesthesia care (MAC). ?                          After obtaining informed consent, the colonoscope  ?                          was passed under direct vision. Throughout the  ?                          procedure, the patient's blood pressure, pulse, and  ?                          oxygen saturations were monitored continuously. The  ?                          PCF-HQ190L ([id number]) scope was introduced through  ?                          the anus and advanced to the the cecum, identified  ?                          by appendiceal orifice and ileocecal valve. The  ?                          colonoscopy was performed without difficulty. The  ?                          patient tolerated the procedure well. The quality  ?  of the bowel preparation was evaluated using the  ?                          BBPS [facility] Bowel Preparation Scale) with scores  ?                          of: Right Colon = 3, Transverse Colon = 3 and Left  ?                          Colon = 3 (entire mucosa seen well with no residual  ?                          staining, small  fragments of stool or opaque  ?                          liquid). The total BBPS score equals 9. ?Scope In: 9:42:26 AM ?Scope Out: 9:54:05 AM ?Scope Withdrawal Time: 0 hours 10 minutes 9 seconds  ?Total Procedure Duration: 0 hours 11 minutes 39 seconds  ?Findings: ?     The perianal and digital rectal examinations were normal. ?     A 7 mm polyp was found in the cecum. The polyp was sessile. The polyp  ?     was removed with a cold snare. Resection and retrieval were complete. ?     The exam was otherwise without abnormality. ?Impression:               - One 7 mm polyp in the cecum, removed with a cold  ?                          snare. Resected and retrieved. ?                          - The examination was otherwise normal. ?Moderate Sedation: ?     Per Anesthesia Care ?Recommendation:           - Patient has a contact number available for  ?                          emergencies. The signs and symptoms of potential  ?                          delayed complications were discussed with the  ?                          patient. Return to normal activities tomorrow.  ?                          Written discharge instructions were provided to the  ?                          patient. ?                          - Resume previous diet. ?                          -  Continue present medications. ?                          - Await pathology results. ?                          - Repeat colonoscopy in 5 years for surveillance. ?                          - Return to GI clinic PRN. ?Procedure Code(s):        --- Professional --- ?                          [phone], Colonoscopy, flexible; with removal of  ?                          tumor(s), polyp(s), or other lesion(s) by snare  ?                          technique ?Diagnosis Code(s):        --- Professional --- ?                          Z12.11, Encounter for screening for malignant  ?                          neoplasm of colon ?                          K63.5, Polyp of colon ?CPT  copyright 2019 American Medical Association. All rights reserved. ?The codes documented in this report are preliminary and upon coder review may  ?be revised to meet current compliance requirements. ?Elon Alas. Abbey Chatters, DO ?Elon Alas. Abbey Chatters, DO ?05/30/2021 9:55:56 AM ?This report has been signed electronically. ?Number of Addenda: 0 ?

## 2021-05-30 NOTE — Anesthesia Preprocedure Evaluation (Signed)
Anesthesia Evaluation  ?Patient identified by MRN, date of birth, ID band ?Patient awake ? ? ? ?Reviewed: ?Allergy & Precautions, H&P , NPO status , Patient's Chart, lab work & pertinent test results, reviewed documented beta blocker date and time  ? ?Airway ?Mallampati: II ? ?TM Distance: >3 FB ?Neck ROM: full ? ? ? Dental ?no notable dental hx. ? ?  ?Pulmonary ?neg pulmonary ROS,  ?  ?Pulmonary exam normal ?breath sounds clear to auscultation ? ? ? ? ? ? Cardiovascular ?Exercise Tolerance: Good ?hypertension, negative cardio ROS ? ? ?Rhythm:regular Rate:Normal ? ? ?  ?Neuro/Psych ?PSYCHIATRIC DISORDERS Anxiety Depression negative neurological ROS ?   ? GI/Hepatic ?negative GI ROS, Neg liver ROS,   ?Endo/Other  ?negative endocrine ROS ? Renal/GU ?negative Renal ROS  ?negative genitourinary ?  ?Musculoskeletal ? ? Abdominal ?  ?Peds ? Hematology ?negative hematology ROS ?(+)   ?Anesthesia Other Findings ? ? Reproductive/Obstetrics ?negative OB ROS ? ?  ? ? ? ? ? ? ? ? ? ? ? ? ? ?  ?  ? ? ? ? ? ? ? ? ?Anesthesia Physical ?Anesthesia Plan ? ?ASA: 2 ? ?Anesthesia Plan: General  ? ?Post-op Pain Management:   ? ?Induction:  ? ?PONV Risk Score and Plan: Propofol infusion ? ?Airway Management Planned:  ? ?Additional Equipment:  ? ?Intra-op Plan:  ? ?Post-operative Plan:  ? ?Informed Consent: I have reviewed the patients History and Physical, chart, labs and discussed the procedure including the risks, benefits and alternatives for the proposed anesthesia with the patient or authorized representative who has indicated his/her understanding and acceptance.  ? ? ? ?Dental Advisory Given ? ?Plan Discussed with: CRNA ? ?Anesthesia Plan Comments:   ? ? ? ? ? ? ?Anesthesia Quick Evaluation ? ?

## 2021-05-30 NOTE — Discharge Instructions (Signed)
?  Colonoscopy Discharge Instructions  Read the instructions outlined below and refer to this sheet in the next few weeks. These discharge instructions provide you with general information on caring for yourself after you leave the hospital. Your doctor may also give you specific instructions. While your treatment has been planned according to the most current medical practices available, unavoidable complications occasionally occur.   ACTIVITY You may resume your regular activity, but move at a slower pace for the next 24 hours.  Take frequent rest periods for the next 24 hours.  Walking will help get rid of the air and reduce the bloated feeling in your belly (abdomen).  No driving for 24 hours (because of the medicine (anesthesia) used during the test).   Do not sign any important legal documents or operate any machinery for 24 hours (because of the anesthesia used during the test).  NUTRITION Drink plenty of fluids.  You may resume your normal diet as instructed by your doctor.  Begin with a light meal and progress to your normal diet. Heavy or fried foods are harder to digest and may make you feel sick to your stomach (nauseated).  Avoid alcoholic beverages for 24 hours or as instructed.  MEDICATIONS You may resume your normal medications unless your doctor tells you otherwise.  WHAT YOU CAN EXPECT TODAY Some feelings of bloating in the abdomen.  Passage of more gas than usual.  Spotting of blood in your stool or on the toilet paper.  IF YOU HAD POLYPS REMOVED DURING THE COLONOSCOPY: No aspirin products for 7 days or as instructed.  No alcohol for 7 days or as instructed.  Eat a soft diet for the next 24 hours.  FINDING OUT THE RESULTS OF YOUR TEST Not all test results are available during your visit. If your test results are not back during the visit, make an appointment with your caregiver to find out the results. Do not assume everything is normal if you have not heard from your  caregiver or the medical facility. It is important for you to follow up on all of your test results.  SEEK IMMEDIATE MEDICAL ATTENTION IF: You have more than a spotting of blood in your stool.  Your belly is swollen (abdominal distention).  You are nauseated or vomiting.  You have a temperature over 101.  You have abdominal pain or discomfort that is severe or gets worse throughout the day.   Your colonoscopy revealed 1 polyp(s) which I removed successfully. Await pathology results, my office will contact you. I recommend repeating colonoscopy in 5 years for surveillance purposes. Otherwise follow up with GI as needed.    I hope you have a great rest of your week!  Alline Pio K. Anzal Bartnick, D.O. Gastroenterology and Hepatology [facility]  

## 2021-06-02 LAB — SURGICAL PATHOLOGY

## 2021-06-06 ENCOUNTER — Encounter (HOSPITAL_COMMUNITY): Payer: Self-pay | Admitting: Internal Medicine

## 2021-07-11 ENCOUNTER — Encounter: Payer: Self-pay | Admitting: Internal Medicine

## 2021-07-11 ENCOUNTER — Ambulatory Visit (INDEPENDENT_AMBULATORY_CARE_PROVIDER_SITE_OTHER): Payer: 59 | Admitting: Internal Medicine

## 2021-07-11 VITALS — BP 136/84 | HR 79 | Resp 18 | Ht 72.0 in | Wt 240.8 lb

## 2021-07-11 DIAGNOSIS — F411 Generalized anxiety disorder: Secondary | ICD-10-CM

## 2021-07-11 DIAGNOSIS — J309 Allergic rhinitis, unspecified: Secondary | ICD-10-CM | POA: Insufficient documentation

## 2021-07-11 DIAGNOSIS — I10 Essential (primary) hypertension: Secondary | ICD-10-CM

## 2021-07-11 DIAGNOSIS — J3089 Other allergic rhinitis: Secondary | ICD-10-CM

## 2021-07-11 DIAGNOSIS — I700 Atherosclerosis of aorta: Secondary | ICD-10-CM

## 2021-07-11 DIAGNOSIS — E0590 Thyrotoxicosis, unspecified without thyrotoxic crisis or storm: Secondary | ICD-10-CM

## 2021-07-11 MED ORDER — DULOXETINE HCL 60 MG PO CPEP: 120.0000 mg | ORAL_CAPSULE | Freq: Every day | ORAL | 1 refills | Status: DC

## 2021-07-12 LAB — CMP14+EGFR
ALT: 17 IU/L (ref 0–44)
AST: 24 IU/L (ref 0–40)
Albumin/Globulin Ratio: 2 (ref 1.2–2.2)
Albumin: 4.3 g/dL (ref 3.8–4.8)
Alkaline Phosphatase: 73 IU/L (ref 44–121)
BUN/Creatinine Ratio: 11 (ref 10–24)
BUN: 12 mg/dL (ref 8–27)
Bilirubin Total: 0.3 mg/dL (ref 0.0–1.2)
CO2: 23 mmol/L (ref 20–29)
Calcium: 9.8 mg/dL (ref 8.6–10.2)
Chloride: 107 mmol/L — ABNORMAL HIGH (ref 96–106)
Creatinine, Ser: 1.05 mg/dL (ref 0.76–1.27)
Globulin, Total: 2.2 g/dL (ref 1.5–4.5)
Glucose: 80 mg/dL (ref 70–99)
Potassium: 4.3 mmol/L (ref 3.5–5.2)
Sodium: 145 mmol/L — ABNORMAL HIGH (ref 134–144)
Total Protein: 6.5 g/dL (ref 6.0–8.5)
eGFR: 78 mL/min/{1.73_m2} (ref >59)

## 2021-07-12 LAB — TSH+FREE T4
Free T4: 0.94 ng/dL (ref 0.82–1.77)
TSH: 0.804 u[IU]/mL (ref 0.450–4.500)

## 2021-08-04 ENCOUNTER — Other Ambulatory Visit: Payer: Self-pay | Admitting: Internal Medicine

## 2021-08-04 DIAGNOSIS — I10 Essential (primary) hypertension: Secondary | ICD-10-CM

## 2021-08-14 ENCOUNTER — Telehealth: Payer: Self-pay | Admitting: Internal Medicine

## 2021-08-14 ENCOUNTER — Other Ambulatory Visit: Payer: Self-pay | Admitting: *Deleted

## 2021-08-14 DIAGNOSIS — F411 Generalized anxiety disorder: Secondary | ICD-10-CM

## 2021-08-14 MED ORDER — DULOXETINE HCL 60 MG PO CPEP: 120.0000 mg | ORAL_CAPSULE | Freq: Every day | ORAL | 1 refills | Status: DC

## 2021-08-14 NOTE — Telephone Encounter (Signed)
Pt called stating that he was told to increase the dosage on his anxiety med. He has been trying to get it refilled but no luck. Wants to know if you can please refill to the new dosage?     Currently on DULoxetine (CYMBALTA) 60 MG capsule but needs it to be '120mg'$ .       [employer]

## 2021-08-14 NOTE — Telephone Encounter (Signed)
Patient medication sent to pharmacy it is sent as 60 mg to take two tablets once a day

## 2021-11-08 ENCOUNTER — Other Ambulatory Visit: Payer: Self-pay | Admitting: Internal Medicine

## 2021-11-08 DIAGNOSIS — I10 Essential (primary) hypertension: Secondary | ICD-10-CM

## 2021-12-15 ENCOUNTER — Other Ambulatory Visit: Payer: 59

## 2021-12-22 ENCOUNTER — Ambulatory Visit: Payer: 59 | Admitting: Urology

## 2022-01-08 ENCOUNTER — Encounter: Payer: 59 | Admitting: Internal Medicine

## 2022-01-08 ENCOUNTER — Encounter: Payer: Self-pay | Admitting: Internal Medicine

## 2022-02-02 ENCOUNTER — Encounter: Payer: Self-pay | Admitting: Internal Medicine

## 2022-02-23 ENCOUNTER — Other Ambulatory Visit: Payer: Self-pay | Admitting: Internal Medicine

## 2022-02-23 DIAGNOSIS — I10 Essential (primary) hypertension: Secondary | ICD-10-CM

## 2022-03-19 ENCOUNTER — Encounter: Payer: Self-pay | Admitting: Radiology

## 2022-04-01 ENCOUNTER — Other Ambulatory Visit: Payer: Self-pay | Admitting: Internal Medicine

## 2022-04-01 DIAGNOSIS — I10 Essential (primary) hypertension: Secondary | ICD-10-CM

## 2022-04-09 ENCOUNTER — Ambulatory Visit (INDEPENDENT_AMBULATORY_CARE_PROVIDER_SITE_OTHER): Payer: Medicare Other | Admitting: Internal Medicine

## 2022-04-09 ENCOUNTER — Encounter: Payer: Self-pay | Admitting: Internal Medicine

## 2022-04-09 VITALS — BP 154/92 | HR 81 | Ht 72.0 in | Wt 237.0 lb

## 2022-04-09 DIAGNOSIS — F411 Generalized anxiety disorder: Secondary | ICD-10-CM

## 2022-04-09 DIAGNOSIS — I700 Atherosclerosis of aorta: Secondary | ICD-10-CM | POA: Diagnosis not present

## 2022-04-09 DIAGNOSIS — E782 Mixed hyperlipidemia: Secondary | ICD-10-CM | POA: Diagnosis not present

## 2022-04-09 DIAGNOSIS — I10 Essential (primary) hypertension: Secondary | ICD-10-CM | POA: Diagnosis not present

## 2022-04-09 DIAGNOSIS — R739 Hyperglycemia, unspecified: Secondary | ICD-10-CM

## 2022-04-09 MED ORDER — LOSARTAN POTASSIUM 100 MG PO TABS: 100.0000 mg | ORAL_TABLET | Freq: Every day | ORAL | 2 refills | Status: DC

## 2022-04-09 MED ORDER — DULOXETINE HCL 60 MG PO CPEP: 60.0000 mg | ORAL_CAPSULE | Freq: Two times a day (BID) | ORAL | 1 refills | Status: DC

## 2022-04-09 NOTE — Patient Instructions (Addendum)
Please start taking Losartan 100 mg once daily for blood pressure.  Please check BP at home once daily and bring the log in the next visit.  Please continue to follow DASH diet and perform moderate exercise/walking at least 150 mins/week.

## 2022-04-09 NOTE — Progress Notes (Signed)
Established Patient Office Visit  Subjective:  Patient ID: Ryan Cain, male    DOB: 02-Apr-1955  Age: 67 y.o. MRN: [id number]  CC:  Chief Complaint  Patient presents with   Hypertension    Follow up    HPI Ryan Cain is a 67 y.o. male with past medical history of HTN, HLD, GAD and OA of knee who presents for f/u of his chronic medical conditions.  HTN: His BP was elevated today. BP was well-controlled with Losartan, but he has run out of it. Takes medications regularly. Patient denies headache, dizziness, chest pain, dyspnea or palpitations.   GAD: Has been doing well with Cymbalta.  He had been more stressed at work and states that his anxiety was not controlled with Cymbalta 60 mg.  His dose of Cymbalta was increased to 120 mg daily in the last visit. Denies anhedonia or SI.    Past Medical History:  Diagnosis Date   Arthritis    Asthma    as a child   Hyperlipidemia    Hypertension    Situational mixed anxiety and depressive disorder    Situational mixed anxiety and depressive disorder     Past Surgical History:  Procedure Laterality Date   COLONOSCOPY WITH PROPOFOL N/A 05/30/2021   Procedure: COLONOSCOPY WITH PROPOFOL;  Surgeon: Eloise Harman, DO;  [facility]: [facility];  Service: Endoscopy;  Laterality: N/A;  1:15 / ASA 3   DENTAL SURGERY     POLYPECTOMY  05/30/2021   Procedure: POLYPECTOMY;  Surgeon: Eloise Harman, DO;  Location: [facility];  Service: Endoscopy;;    Family History  Problem Relation Age of Onset   Arthritis Mother    Hypertension Mother    Hyperlipidemia Mother    Heart disease Father 14       heart attack   Heart disease Paternal Uncle     Social History   Socioeconomic History   Marital status: Married    Spouse name: Altha Harm   Number of children: 1   Years of education: 13   Highest education level: Not on file  Occupational History   Occupation: Librarian, academic    Comment: [employer]- plant closed  Tobacco Use   Smoking  status: Never   Smokeless tobacco: Never  Vaping Use   Vaping Use: Never used  Substance and Sexual Activity   Alcohol use: Yes    Alcohol/week: 6.0 standard drinks of alcohol    Types: 6 Shots of liquor per week    Comment: 3x a week bourbon   Drug use: No   Sexual activity: Yes    Birth control/protection: Post-menopausal  Other Topics Concern   Not on file  Social History Narrative   Lives with wife Altha Harm who has spina bifida- affected mildly.Married over 40 years.   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   Social Determinants of Health   Financial Resource Strain: Not on file  Food Insecurity: Not on file  Transportation Needs: Not on file  Physical Activity: Not on file  Stress: Not on file  Social Connections: Not on file  Intimate Partner Violence: Not on file    Outpatient Medications Prior to Visit  Medication Sig Dispense Refill   acetaminophen (TYLENOL) 650 MG CR tablet Take 650-1,300 mg by mouth every 8 (eight) hours as needed for pain.     Cholecalciferol (VITAMIN D3) 125 MCG (5000 UT) CAPS Take 5,000 Units by mouth daily.     fluticasone (FLONASE) 50  MCG/ACT nasal spray Place 2 sprays into both nostrils daily. 16 g 6   loratadine (CLARITIN) 10 MG tablet Take 10 mg by mouth daily.     Multiple Vitamins-Minerals (MULTIVITAMIN WITH MINERALS) tablet Take 1 tablet by mouth daily.     Omega-3 Fatty Acids (FISH OIL PO) Take 2,000 mg by mouth in the morning and at bedtime.     OVER THE COUNTER MEDICATION Take 2 capsules by mouth daily. Beet Root     sildenafil (VIAGRA) 50 MG tablet Take 1 tablet (50 mg total) by mouth daily as needed for erectile dysfunction. 30 tablet 2   DULoxetine (CYMBALTA) 60 MG capsule Take 2 capsules (120 mg total) by mouth daily. 180 capsule 1   losartan (COZAAR) 100 MG tablet TAKE 1 TABLET BY MOUTH DAILY 30 tablet 0   No facility-administered medications prior to visit.    No Known Allergies  ROS Review of Systems   Constitutional:  Negative for chills and fever.  HENT:  Negative for congestion and sore throat.   Eyes:  Negative for pain and discharge.  Respiratory:  Negative for cough and shortness of breath.   Cardiovascular:  Negative for chest pain and palpitations.  Gastrointestinal:  Negative for constipation, diarrhea, nausea and vomiting.  Endocrine: Negative for polydipsia and polyuria.  Genitourinary:  Negative for dysuria and hematuria.  Musculoskeletal:  Positive for arthralgias (B/l knee). Negative for neck pain and neck stiffness.  Skin:  Negative for rash.  Neurological:  Negative for dizziness, weakness, numbness and headaches.  Psychiatric/Behavioral:  Negative for agitation and behavioral problems.       Objective:    Physical Exam Vitals reviewed.  Constitutional:      General: He is not in acute distress.    Appearance: He is not diaphoretic.  HENT:     Head: Normocephalic and atraumatic.     Nose: No congestion.     Mouth/Throat:     Mouth: Mucous membranes are moist.     Pharynx: No oropharyngeal exudate.  Eyes:     General: No scleral icterus.    Extraocular Movements: Extraocular movements intact.  Cardiovascular:     Rate and Rhythm: Normal rate and regular rhythm.     Pulses: Normal pulses.     Heart sounds: Normal heart sounds. No murmur heard. Pulmonary:     Breath sounds: Normal breath sounds. No wheezing or rales.  Musculoskeletal:     Cervical back: Neck supple. No tenderness.     Right lower leg: No edema.     Left lower leg: No edema.  Skin:    General: Skin is warm.     Findings: No rash.  Neurological:     General: No focal deficit present.     Mental Status: He is alert and oriented to person, place, and time.     Cranial Nerves: No cranial nerve deficit.     Sensory: No sensory deficit.     Motor: No weakness.  Psychiatric:        Mood and Affect: Mood normal.        Behavior: Behavior normal.     BP (!) 154/92 (BP Location: Right  Arm, Cuff Size: Normal)   Pulse 81   Ht 6' (1.829 m)   Wt 237 lb (107.5 kg)   SpO2 97%   BMI 32.14 kg/m  Wt Readings from Last 3 Encounters:  04/09/22 237 lb (107.5 kg)  07/11/21 240 lb 12.8 oz (109.2 kg)  05/30/21 245 lb (111.1  kg)    Lab Results  Component Value Date   TSH 0.804 07/11/2021   Lab Results  Component Value Date   WBC 4.2 01/10/2021   HGB 14.2 01/10/2021   HCT 41.0 01/10/2021   MCV 95 01/10/2021   PLT 184 01/10/2021   Lab Results  Component Value Date   NA 145 (H) 07/11/2021   K 4.3 07/11/2021   CO2 23 07/11/2021   GLUCOSE 80 07/11/2021   BUN 12 07/11/2021   CREATININE 1.05 07/11/2021   BILITOT 0.3 07/11/2021   ALKPHOS 73 07/11/2021   AST 24 07/11/2021   ALT 17 07/11/2021   PROT 6.5 07/11/2021   ALBUMIN 4.3 07/11/2021   CALCIUM 9.8 07/11/2021   ANIONGAP 10 10/01/2020   EGFR 78 07/11/2021   Lab Results  Component Value Date   CHOL 225 (H) 11/14/2019   Lab Results  Component Value Date   HDL 67 11/14/2019   Lab Results  Component Value Date   LDLCALC 142 (H) 11/14/2019   Lab Results  Component Value Date   TRIG 70 11/14/2019   Lab Results  Component Value Date   CHOLHDL 3.4 11/14/2019   Lab Results  Component Value Date   HGBA1C 5.1 01/10/2021      Assessment & Plan:   Problem List Items Addressed This Visit       Cardiovascular and Mediastinum   Essential hypertension - Primary    BP Readings from Last 1 Encounters:  04/09/22 (!) 154/92  Elevated today as he has run out of losartan Usually remains well-controlled now with losartan 100 mg QD, refilled for now Counseled for compliance with the medications Advised DASH diet and moderate exercise/walking, at least 150 mins/week      Relevant Medications   losartan (COZAAR) 100 MG tablet   Other Relevant Orders   CMP14+EGFR   TSH + free T4   Atherosclerosis of abdominal aorta (HCC)    Incidental finding on imaging Check lipid profile Start statin if persistent HLD       Relevant Medications   losartan (COZAAR) 100 MG tablet   Other Relevant Orders   Lipid Profile     Other   HLD (hyperlipidemia)    Check lipid profile Plan to start statin if persistent HLD      Relevant Medications   losartan (COZAAR) 100 MG tablet   Other Relevant Orders   Lipid Profile   GAD (generalized anxiety disorder)    Well-controlled with Cymbalta 120 mg QD Switched to Cymbalta 60 mg BID Will reevaluate and decrease dose later      Relevant Medications   DULoxetine (CYMBALTA) 60 MG capsule   Other Relevant Orders   TSH + free T4   Other Visit Diagnoses     Hyperglycemia       Relevant Orders   Hemoglobin A1c      Meds ordered this encounter  Medications   DULoxetine (CYMBALTA) 60 MG capsule    Sig: Take 1 capsule (60 mg total) by mouth 2 (two) times daily.    Dispense:  180 capsule    Refill:  1   losartan (COZAAR) 100 MG tablet    Sig: Take 1 tablet (100 mg total) by mouth daily.    Dispense:  30 tablet    Refill:  2    Follow-up: Return in about 3 weeks (around 04/30/2022) for HTN.    Lindell Spar, MD

## 2022-04-09 NOTE — Assessment & Plan Note (Signed)
Incidental finding on imaging Check lipid profile Start statin if persistent HLD 

## 2022-04-09 NOTE — Assessment & Plan Note (Signed)
BP Readings from Last 1 Encounters:  04/09/22 (!) 154/92   Elevated today as he has run out of losartan Usually remains well-controlled now with losartan 100 mg QD, refilled for now Counseled for compliance with the medications Advised DASH diet and moderate exercise/walking, at least 150 mins/week

## 2022-04-09 NOTE — Assessment & Plan Note (Signed)
Check lipid profile Plan to start statin if persistent HLD 

## 2022-04-09 NOTE — Assessment & Plan Note (Signed)
Well-controlled with Cymbalta 120 mg QD Switched to Cymbalta 60 mg BID Will reevaluate and decrease dose later

## 2022-04-10 LAB — CMP14+EGFR
ALT: 19 IU/L (ref 0–44)
AST: 22 IU/L (ref 0–40)
Albumin/Globulin Ratio: 1.8 (ref 1.2–2.2)
Albumin: 4.2 g/dL (ref 3.9–4.9)
Alkaline Phosphatase: 57 IU/L (ref 44–121)
BUN/Creatinine Ratio: 13 (ref 10–24)
BUN: 14 mg/dL (ref 8–27)
Bilirubin Total: 0.4 mg/dL (ref 0.0–1.2)
CO2: 22 mmol/L (ref 20–29)
Calcium: 9.6 mg/dL (ref 8.6–10.2)
Chloride: 106 mmol/L (ref 96–106)
Creatinine, Ser: 1.07 mg/dL (ref 0.76–1.27)
Globulin, Total: 2.3 g/dL (ref 1.5–4.5)
Glucose: 98 mg/dL (ref 70–99)
Potassium: 3.9 mmol/L (ref 3.5–5.2)
Sodium: 144 mmol/L (ref 134–144)
Total Protein: 6.5 g/dL (ref 6.0–8.5)
eGFR: 76 mL/min/{1.73_m2} (ref >59)

## 2022-04-10 LAB — LIPID PANEL
Chol/HDL Ratio: 3.3 ratio (ref 0.0–5.0)
Cholesterol, Total: 229 mg/dL — ABNORMAL HIGH (ref 100–199)
HDL: 70 mg/dL (ref >39)
LDL Chol Calc (NIH): 137 mg/dL — ABNORMAL HIGH (ref 0–99)
Triglycerides: 123 mg/dL (ref 0–149)
VLDL Cholesterol Cal: 22 mg/dL (ref 5–40)

## 2022-04-10 LAB — TSH+FREE T4
Free T4: 0.82 ng/dL (ref 0.82–1.77)
TSH: 0.83 u[IU]/mL (ref 0.450–4.500)

## 2022-04-10 LAB — HEMOGLOBIN A1C
Est. average glucose Bld gHb Est-mCnc: 105 mg/dL
Hgb A1c MFr Bld: 5.3 % (ref 4.8–5.6)

## 2022-04-30 ENCOUNTER — Ambulatory Visit (INDEPENDENT_AMBULATORY_CARE_PROVIDER_SITE_OTHER): Payer: Medicare Other | Admitting: Internal Medicine

## 2022-04-30 ENCOUNTER — Encounter: Payer: Self-pay | Admitting: Internal Medicine

## 2022-04-30 VITALS — BP 168/94 | HR 78 | Ht 72.0 in | Wt 239.6 lb

## 2022-04-30 DIAGNOSIS — E6609 Other obesity due to excess calories: Secondary | ICD-10-CM | POA: Diagnosis not present

## 2022-04-30 DIAGNOSIS — E782 Mixed hyperlipidemia: Secondary | ICD-10-CM | POA: Diagnosis not present

## 2022-04-30 DIAGNOSIS — I700 Atherosclerosis of aorta: Secondary | ICD-10-CM

## 2022-04-30 DIAGNOSIS — F411 Generalized anxiety disorder: Secondary | ICD-10-CM

## 2022-04-30 DIAGNOSIS — I10 Essential (primary) hypertension: Secondary | ICD-10-CM | POA: Diagnosis not present

## 2022-04-30 DIAGNOSIS — F331 Major depressive disorder, recurrent, moderate: Secondary | ICD-10-CM | POA: Insufficient documentation

## 2022-04-30 MED ORDER — CHLORTHALIDONE 25 MG PO TABS: 25.0000 mg | ORAL_TABLET | Freq: Every day | ORAL | 1 refills | Status: DC

## 2022-04-30 MED ORDER — ROSUVASTATIN CALCIUM 5 MG PO TABS
5.0000 mg | ORAL_TABLET | Freq: Every day | ORAL | 3 refills | Status: DC
Start: 2022-04-30 — End: 2023-01-05

## 2022-04-30 NOTE — Assessment & Plan Note (Signed)
Incidental finding on imaging Checked lipid profile Added Crestor

## 2022-04-30 NOTE — Patient Instructions (Addendum)
Please start taking Chlorthalidone as prescribed for blood pressure.  Please start taking Rosuvastatin at bedtime for cholesterol. Please take CoQ10 100 mg once daily if you have leg cramps.  Please continue to take other medications as prescribed.  Please continue to follow low carb diet and perform moderate exercise/walking at least 150 mins/week.

## 2022-04-30 NOTE — Assessment & Plan Note (Signed)
Well-controlled with Cymbalta 60 mg BID Will reevaluate and decrease dose later

## 2022-04-30 NOTE — Assessment & Plan Note (Signed)
Has lost more than 40 lbs with diet modification Moderate exercise/walking advised 

## 2022-04-30 NOTE — Progress Notes (Signed)
Established Patient Office Visit  Subjective:  Patient ID: Ryan Cain, male    DOB: 1955-05-30  Age: 67 y.o. MRN: [id number]  CC:  Chief Complaint  Patient presents with   Hypertension    Three week follow up    HPI Ryan Cain is a 67 y.o. male with past medical history of HTN, HLD, GAD and OA of knee who presents for f/u of his chronic medical conditions.  HTN: His BP was elevated today. BP was well-controlled with Losartan, which was started in the last visit. Takes medications regularly. Patient denies headache, dizziness, chest pain, dyspnea or palpitations.   GAD: Has been doing well with Cymbalta.  He had been more stressed at work and states that his anxiety was not controlled with Cymbalta 60 mg.  His dose of Cymbalta was increased to 60 mg BID in the last visit. Denies anhedonia or SI.  HLD: His blood tests showed elevated cholesterol level. He has history of aortic atherosclerosis.   Past Medical History:  Diagnosis Date   Arthritis    Asthma    as a child   Hyperlipidemia    Hypertension    Situational mixed anxiety and depressive disorder    Situational mixed anxiety and depressive disorder     Past Surgical History:  Procedure Laterality Date   COLONOSCOPY WITH PROPOFOL N/A 05/30/2021   Procedure: COLONOSCOPY WITH PROPOFOL;  Surgeon: Lanelle Bal, DO;  [facility]: [facility];  Service: Endoscopy;  Laterality: N/A;  1:15 / ASA 3   DENTAL SURGERY     POLYPECTOMY  05/30/2021   Procedure: POLYPECTOMY;  Surgeon: Lanelle Bal, DO;  [facility]: [facility];  Service: Endoscopy;;    Family History  Problem Relation Age of Onset   Arthritis Mother    Hypertension Mother    Hyperlipidemia Mother    Heart disease Father 39       heart attack   Heart disease Paternal Uncle     Social History   Socioeconomic History   Marital status: Married    Spouse name: Wynona Canes   Number of children: 1   Years of education: 13   Highest education  level: Not on file  Occupational History   Occupation: Merchandiser, 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  Tobacco Use   Smoking status: Never   Smokeless tobacco: Never  Vaping Use   Vaping Use: Never used  Substance and Sexual Activity   Alcohol use: Yes    Alcohol/week: 6.0 standard drinks of alcohol    Types: 6 Shots of liquor per week    Comment: 3x a week bourbon   Drug use: No   Sexual activity: Yes    Birth control/protection: Post-menopausal  Other Topics Concern   Not on file  Social History Narrative   Lives with wife Wynona Canes who has spina bifida- affected mildly.Married over 40 years.   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   Social Determinants of Health   Financial Resource Strain: Not on file  Food Insecurity: Not on file  Transportation Needs: Not on file  Physical Activity: Not on file  Stress: Not on file  Social Connections: Not on file  Intimate Partner Violence: Not on file    Outpatient Medications Prior to Visit  Medication Sig Dispense Refill   acetaminophen (TYLENOL) 650 MG CR tablet Take 650-1,300 mg by mouth every 8 (eight) hours as needed for pain.     Cholecalciferol (VITAMIN D3) 125 MCG (  5000 UT) CAPS Take 5,000 Units by mouth daily.     DULoxetine (CYMBALTA) 60 MG capsule Take 1 capsule (60 mg total) by mouth 2 (two) times daily. 180 capsule 1   fluticasone (FLONASE) 50 MCG/ACT nasal spray Place 2 sprays into both nostrils daily. 16 g 6   loratadine (CLARITIN) 10 MG tablet Take 10 mg by mouth daily.     losartan (COZAAR) 100 MG tablet Take 1 tablet (100 mg total) by mouth daily. 30 tablet 2   Multiple Vitamins-Minerals (MULTIVITAMIN WITH MINERALS) tablet Take 1 tablet by mouth daily.     Omega-3 Fatty Acids (FISH OIL PO) Take 2,000 mg by mouth in the morning and at bedtime.     OVER THE COUNTER MEDICATION Take 2 capsules by mouth daily. Beet Root     sildenafil (VIAGRA) 50 MG tablet Take 1 tablet (50 mg total) by mouth daily as  needed for erectile dysfunction. 30 tablet 2   No facility-administered medications prior to visit.    No Known Allergies  ROS Review of Systems  Constitutional:  Negative for chills and fever.  HENT:  Negative for congestion and sore throat.   Eyes:  Negative for pain and discharge.  Respiratory:  Negative for cough and shortness of breath.   Cardiovascular:  Negative for chest pain and palpitations.  Gastrointestinal:  Negative for constipation, diarrhea, nausea and vomiting.  Endocrine: Negative for polydipsia and polyuria.  Genitourinary:  Negative for dysuria and hematuria.  Musculoskeletal:  Positive for arthralgias (B/l knee). Negative for neck pain and neck stiffness.  Skin:  Negative for rash.  Neurological:  Negative for dizziness, weakness, numbness and headaches.  Psychiatric/Behavioral:  Negative for agitation and behavioral problems.       Objective:    Physical Exam Vitals reviewed.  Constitutional:      General: He is not in acute distress.    Appearance: He is not diaphoretic.  HENT:     Head: Normocephalic and atraumatic.     Nose: No congestion.     Mouth/Throat:     Mouth: Mucous membranes are moist.     Pharynx: No oropharyngeal exudate.  Eyes:     General: No scleral icterus.    Extraocular Movements: Extraocular movements intact.  Cardiovascular:     Rate and Rhythm: Normal rate and regular rhythm.     Pulses: Normal pulses.     Heart sounds: Normal heart sounds. No murmur heard. Pulmonary:     Breath sounds: Normal breath sounds. No wheezing or rales.  Musculoskeletal:     Cervical back: Neck supple. No tenderness.     Right lower leg: No edema.     Left lower leg: No edema.  Skin:    General: Skin is warm.     Findings: No rash.  Neurological:     General: No focal deficit present.     Mental Status: He is alert and oriented to person, place, and time.     Cranial Nerves: No cranial nerve deficit.     Sensory: No sensory deficit.      Motor: No weakness.  Psychiatric:        Mood and Affect: Mood normal.        Behavior: Behavior normal.     BP (!) 168/94 (BP Location: Left Arm, Cuff Size: Normal)   Pulse 78   Ht 6' (1.829 m)   Wt 239 lb 9.6 oz (108.7 kg)   SpO2 97%   BMI 32.50 kg/m  Wt  Readings from Last 3 Encounters:  04/30/22 239 lb 9.6 oz (108.7 kg)  04/09/22 237 lb (107.5 kg)  07/11/21 240 lb 12.8 oz (109.2 kg)    Lab Results  Component Value Date   TSH 0.830 04/09/2022   Lab Results  Component Value Date   WBC 4.2 01/10/2021   HGB 14.2 01/10/2021   HCT 41.0 01/10/2021   MCV 95 01/10/2021   PLT 184 01/10/2021   Lab Results  Component Value Date   NA 144 04/09/2022   K 3.9 04/09/2022   CO2 22 04/09/2022   GLUCOSE 98 04/09/2022   BUN 14 04/09/2022   CREATININE 1.07 04/09/2022   BILITOT 0.4 04/09/2022   ALKPHOS 57 04/09/2022   AST 22 04/09/2022   ALT 19 04/09/2022   PROT 6.5 04/09/2022   ALBUMIN 4.2 04/09/2022   CALCIUM 9.6 04/09/2022   ANIONGAP 10 10/01/2020   EGFR 76 04/09/2022   Lab Results  Component Value Date   CHOL 229 (H) 04/09/2022   Lab Results  Component Value Date   HDL 70 04/09/2022   Lab Results  Component Value Date   LDLCALC 137 (H) 04/09/2022   Lab Results  Component Value Date   TRIG 123 04/09/2022   Lab Results  Component Value Date   CHOLHDL 3.3 04/09/2022   Lab Results  Component Value Date   HGBA1C 5.3 04/09/2022      Assessment & Plan:   Problem List Items Addressed This Visit       Cardiovascular and Mediastinum   Essential hypertension - Primary    BP Readings from Last 1 Encounters:  04/30/22 (!) 168/94  Elevated today Uncontrolled now with losartan 100 mg QD Added Chlorthalidone 25 mg QD Counseled for compliance with the medications Advised DASH diet and moderate exercise/walking, at least 150 mins/week      Relevant Medications   chlorthalidone (HYGROTON) 25 MG tablet   rosuvastatin (CRESTOR) 5 MG tablet   Atherosclerosis  of abdominal aorta    Incidental finding on imaging Checked lipid profile Added Crestor      Relevant Medications   chlorthalidone (HYGROTON) 25 MG tablet   rosuvastatin (CRESTOR) 5 MG tablet     Other   HLD (hyperlipidemia)    Checked lipid profile Added Crestor      Relevant Medications   chlorthalidone (HYGROTON) 25 MG tablet   rosuvastatin (CRESTOR) 5 MG tablet   Obesity    Has lost more than 40 lbs with diet modification Moderate exercise/walking advised      GAD (generalized anxiety disorder)    Well-controlled with Cymbalta 60 mg BID Will reevaluate and decrease dose later      Moderate episode of recurrent major depressive disorder    Flowsheet Row Office Visit from 04/30/2022 in [facility]  PHQ-9 Total Score 10     Worse recently due to work-related stress On Cymbalta 60 mg BID, continue for now as he states that it is improving      Meds ordered this encounter  Medications   chlorthalidone (HYGROTON) 25 MG tablet    Sig: Take 1 tablet (25 mg total) by mouth daily.    Dispense:  90 tablet    Refill:  1   rosuvastatin (CRESTOR) 5 MG tablet    Sig: Take 1 tablet (5 mg total) by mouth daily.    Dispense:  90 tablet    Refill:  3    Follow-up: Return in about 6 weeks (around 06/11/2022) for HTN  and HLD.    Anabel Halon, MD

## 2022-04-30 NOTE — Assessment & Plan Note (Signed)
BP Readings from Last 1 Encounters:  04/30/22 (!) 168/94   Elevated today Uncontrolled now with losartan 100 mg QD Added Chlorthalidone 25 mg QD Counseled for compliance with the medications Advised DASH diet and moderate exercise/walking, at least 150 mins/week

## 2022-04-30 NOTE — Assessment & Plan Note (Signed)
Flowsheet Row Office Visit from 04/30/2022 in [facility]  PHQ-9 Total Score 10      Worse recently due to work-related stress On Cymbalta 60 mg BID, continue for now as he states that it is improving

## 2022-04-30 NOTE — Assessment & Plan Note (Signed)
Checked lipid profile Added Crestor

## 2022-06-25 ENCOUNTER — Encounter: Payer: Self-pay | Admitting: Internal Medicine

## 2022-06-25 ENCOUNTER — Ambulatory Visit (INDEPENDENT_AMBULATORY_CARE_PROVIDER_SITE_OTHER): Payer: Medicare Other | Admitting: Internal Medicine

## 2022-06-25 VITALS — BP 148/86 | HR 84 | Ht 72.0 in | Wt 237.0 lb

## 2022-06-25 DIAGNOSIS — F331 Major depressive disorder, recurrent, moderate: Secondary | ICD-10-CM | POA: Diagnosis not present

## 2022-06-25 DIAGNOSIS — E782 Mixed hyperlipidemia: Secondary | ICD-10-CM

## 2022-06-25 DIAGNOSIS — I10 Essential (primary) hypertension: Secondary | ICD-10-CM

## 2022-06-25 MED ORDER — AMLODIPINE BESYLATE 5 MG PO TABS: 5.0000 mg | ORAL_TABLET | Freq: Every day | ORAL | 0 refills | Status: DC

## 2022-06-25 NOTE — Progress Notes (Signed)
Established Patient Office Visit  Subjective:  Patient ID: Ryan Cain, male    DOB: 03-15-1955  Age: 67 y.o. MRN: [id number]  CC:  Chief Complaint  Patient presents with   Hypertension    Follow up    HPI Ryan Cain is a 67 y.o. male with past medical history of HTN, HLD, GAD and OA of knee who presents for f/u of his chronic medical conditions.  HTN: His BP was elevated today. BP was well-controlled with Losartan and Chlorthalidone, which was started in the last visit. But he had urinary frequency with chlorthalidone and he had to stop taking it.  Takes medications regularly. Patient denies headache, dizziness, chest pain, dyspnea or palpitations.  GAD: Has been doing well with Cymbalta.  He had been more stressed at work and states that his anxiety was not controlled with Cymbalta 60 mg.  His dose of Cymbalta was increased to 60 mg BID in the last visit. Denies anhedonia or SI.  HLD: His blood tests showed elevated cholesterol level. He has history of aortic atherosclerosis.  He was placed on Crestor, but had leg cramps with it.  He agrees to try it every other day.  Past Medical History:  Diagnosis Date   Arthritis    Asthma    as a child   Hyperlipidemia    Hypertension    Situational mixed anxiety and depressive disorder    Situational mixed anxiety and depressive disorder     Past Surgical History:  Procedure Laterality Date   COLONOSCOPY WITH PROPOFOL N/A 05/30/2021   Procedure: COLONOSCOPY WITH PROPOFOL;  Surgeon: Lanelle Bal, DO;  [facility]: [facility];  Service: Endoscopy;  Laterality: N/A;  1:15 / ASA 3   DENTAL SURGERY     POLYPECTOMY  05/30/2021   Procedure: POLYPECTOMY;  Surgeon: Lanelle Bal, DO;  Location: [facility];  Service: Endoscopy;;    Family History  Problem Relation Age of Onset   Arthritis Mother    Hypertension Mother    Hyperlipidemia Mother    Heart disease Father 51       heart attack   Heart disease Paternal Uncle      Social History   Socioeconomic History   Marital status: Married    Spouse name: Ryan Cain   Number of children: 1   Years of education: 13   Highest education level: Not on file  Occupational History   Occupation: Merchandiser, 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   Social Determinants of Health   Financial Resource Strain: Not on file  Food Insecurity: Not on file  Transportation Needs: Not on file  Physical Activity: Not on file  Stress: Not on file  Social Connections: Not on file  Intimate Partner Violence: Not on file    Outpatient Medications Prior to Visit  Medication Sig Dispense Refill   acetaminophen (TYLENOL) 650 MG CR tablet Take 650-1,300 mg by mouth every 8 (eight) hours as needed for pain.     Cholecalciferol (VITAMIN D3) 125 MCG (5000 UT) CAPS Take 5,000 Units by mouth daily.     DULoxetine (CYMBALTA) 60 MG capsule Take 1 capsule (60 mg total) by mouth 2 (two) times daily. 180 capsule 1   fluticasone (FLONASE) 50 MCG/ACT nasal spray Place 2 sprays into both nostrils daily. 16 g 6   loratadine (CLARITIN) 10 MG tablet Take 10 mg by mouth daily.     losartan (COZAAR) 100 MG tablet Take 1 tablet (100 mg total) by mouth daily. 30 tablet 2   Multiple Vitamins-Minerals (MULTIVITAMIN WITH MINERALS) tablet Take 1 tablet by mouth daily.     Omega-3 Fatty Acids (FISH OIL PO) Take 2,000 mg by mouth in the morning and  at bedtime.     OVER THE COUNTER MEDICATION Take 2 capsules by mouth daily. Beet Root     rosuvastatin (CRESTOR) 5 MG tablet Take 1 tablet (5 mg total) by mouth daily. 90 tablet 3   sildenafil (VIAGRA) 50 MG tablet Take 1 tablet (50 mg total) by mouth daily as needed for erectile dysfunction. 30 tablet 2   chlorthalidone (HYGROTON) 25 MG tablet Take 1 tablet (25 mg total) by mouth daily. 90 tablet 1   No facility-administered medications prior to visit.    No Known Allergies  ROS Review of Systems  Constitutional:  Negative for chills and fever.  HENT:  Negative for congestion and sore throat.   Eyes:  Negative for pain and discharge.  Respiratory:  Negative for cough and shortness of breath.   Cardiovascular:  Negative for chest pain and palpitations.  Gastrointestinal:  Negative for constipation, diarrhea, nausea and vomiting.  Endocrine: Negative for polydipsia and polyuria.  Genitourinary:  Negative for dysuria and hematuria.  Musculoskeletal:  Positive for arthralgias (B/l knee). Negative for neck pain and neck stiffness.  Skin:  Negative for rash.  Neurological:  Negative for dizziness, weakness, numbness and headaches.  Psychiatric/Behavioral:  Negative for agitation and behavioral problems.       Objective:    Physical Exam Vitals reviewed.  Constitutional:      General: He is not in acute distress.    Appearance: He is not diaphoretic.  HENT:     Head: Normocephalic and atraumatic.     Nose: No congestion.     Mouth/Throat:     Mouth: Mucous membranes are moist.     Pharynx: No oropharyngeal exudate.  Eyes:     General: No scleral icterus.    Extraocular Movements: Extraocular movements intact.  Cardiovascular:     Rate and Rhythm: Normal rate and regular rhythm.     Pulses: Normal pulses.     Heart sounds: Normal heart sounds. No murmur heard. Pulmonary:     Breath sounds: Normal breath sounds. No wheezing or rales.  Musculoskeletal:     Cervical back:  Neck supple. No tenderness.     Right lower leg: No edema.     Left lower leg: No edema.  Skin:    General: Skin is warm.     Findings: No rash.  Neurological:     General: No focal deficit present.     Mental Status: He is alert and oriented to person, place, and time.     Cranial Nerves: No cranial nerve deficit.     Sensory: No sensory deficit.  Motor: No weakness.  Psychiatric:        Mood and Affect: Mood normal.        Behavior: Behavior normal.     BP (!) 148/86 (BP Location: Left Arm)   Pulse 84   Ht 6' (1.829 m)   Wt 237 lb (107.5 kg)   SpO2 96%   BMI 32.14 kg/m  Wt Readings from Last 3 Encounters:  06/25/22 237 lb (107.5 kg)  04/30/22 239 lb 9.6 oz (108.7 kg)  04/09/22 237 lb (107.5 kg)    Lab Results  Component Value Date   TSH 0.830 04/09/2022   Lab Results  Component Value Date   WBC 4.2 01/10/2021   HGB 14.2 01/10/2021   HCT 41.0 01/10/2021   MCV 95 01/10/2021   PLT 184 01/10/2021   Lab Results  Component Value Date   NA 144 04/09/2022   K 3.9 04/09/2022   CO2 22 04/09/2022   GLUCOSE 98 04/09/2022   BUN 14 04/09/2022   CREATININE 1.07 04/09/2022   BILITOT 0.4 04/09/2022   ALKPHOS 57 04/09/2022   AST 22 04/09/2022   ALT 19 04/09/2022   PROT 6.5 04/09/2022   ALBUMIN 4.2 04/09/2022   CALCIUM 9.6 04/09/2022   ANIONGAP 10 10/01/2020   EGFR 76 04/09/2022   Lab Results  Component Value Date   CHOL 229 (H) 04/09/2022   Lab Results  Component Value Date   HDL 70 04/09/2022   Lab Results  Component Value Date   LDLCALC 137 (H) 04/09/2022   Lab Results  Component Value Date   TRIG 123 04/09/2022   Lab Results  Component Value Date   CHOLHDL 3.3 04/09/2022   Lab Results  Component Value Date   HGBA1C 5.3 04/09/2022      Assessment & Plan:   Problem List Items Addressed This Visit       Cardiovascular and Mediastinum   Essential hypertension - Primary    BP Readings from Last 1 Encounters:  06/25/22 (!) 148/86   Elevated today Uncontrolled now with losartan 100 mg QD DC Chlorthalidone 25 mg QD as he had urinary frequency Started Amlodipine 5 mg QD Counseled for compliance with the medications Advised DASH diet and moderate exercise/walking, at least 150 mins/week      Relevant Medications   amLODipine (NORVASC) 5 MG tablet     Other   HLD (hyperlipidemia)    Checked lipid profile Added Crestor, but needs to take it, agrees to try QOD Advised to take CoQ10 50 mg QD for leg cramps      Relevant Medications   amLODipine (NORVASC) 5 MG tablet   Moderate episode of recurrent major depressive disorder (HCC)    Flowsheet Row Office Visit from 06/25/2022 in [facility]  PHQ-9 Total Score 10     Worse recently due to work-related stress On Cymbalta 60 mg BID, continue for now as he states that it is improving      Meds ordered this encounter  Medications   amLODipine (NORVASC) 5 MG tablet    Sig: Take 1 tablet (5 mg total) by mouth daily.    Dispense:  90 tablet    Refill:  0    Follow-up: Return in about 3 months (around 09/25/2022) for HTN.    Anabel Halon, MD

## 2022-06-25 NOTE — Patient Instructions (Addendum)
Please start taking Amlodipine 5 mg once daily. Continue taking Losartan as prescribed. Please stop taking Chlorthalidone.  Please take Rosuvastatin every other day for now.  Please CoQ10 50 mg once daily.  Please continue to follow low salt diet and perform moderate exercise/walking at least 150 mins/week.

## 2022-06-26 NOTE — Assessment & Plan Note (Signed)
Flowsheet Row Office Visit from 06/25/2022 in [facility]  PHQ-9 Total Score 10      Worse recently due to work-related stress On Cymbalta 60 mg BID, continue for now as he states that it is improving

## 2022-06-26 NOTE — Assessment & Plan Note (Signed)
BP Readings from Last 1 Encounters:  06/25/22 (!) 148/86   Elevated today Uncontrolled now with losartan 100 mg QD DC Chlorthalidone 25 mg QD as he had urinary frequency Started Amlodipine 5 mg QD Counseled for compliance with the medications Advised DASH diet and moderate exercise/walking, at least 150 mins/week

## 2022-06-26 NOTE — Assessment & Plan Note (Signed)
Checked lipid profile Added Crestor, but needs to take it, agrees to try QOD Advised to take CoQ10 50 mg QD for leg cramps

## 2022-07-13 ENCOUNTER — Other Ambulatory Visit: Payer: Self-pay | Admitting: Internal Medicine

## 2022-07-13 DIAGNOSIS — I10 Essential (primary) hypertension: Secondary | ICD-10-CM

## 2022-08-18 ENCOUNTER — Other Ambulatory Visit: Payer: Self-pay | Admitting: Internal Medicine

## 2022-08-18 DIAGNOSIS — I10 Essential (primary) hypertension: Secondary | ICD-10-CM

## 2022-09-22 ENCOUNTER — Encounter: Payer: Self-pay | Admitting: Pharmacist

## 2022-09-28 ENCOUNTER — Encounter: Payer: Self-pay | Admitting: Internal Medicine

## 2022-09-28 ENCOUNTER — Ambulatory Visit (INDEPENDENT_AMBULATORY_CARE_PROVIDER_SITE_OTHER): Payer: PRIVATE HEALTH INSURANCE | Admitting: Internal Medicine

## 2022-09-28 ENCOUNTER — Other Ambulatory Visit: Payer: Self-pay | Admitting: Internal Medicine

## 2022-09-28 VITALS — BP 158/84 | HR 86 | Ht 72.0 in | Wt 238.2 lb

## 2022-09-28 DIAGNOSIS — Z23 Encounter for immunization: Secondary | ICD-10-CM | POA: Diagnosis not present

## 2022-09-28 DIAGNOSIS — N3000 Acute cystitis without hematuria: Secondary | ICD-10-CM | POA: Diagnosis not present

## 2022-09-28 DIAGNOSIS — F411 Generalized anxiety disorder: Secondary | ICD-10-CM

## 2022-09-28 DIAGNOSIS — I10 Essential (primary) hypertension: Secondary | ICD-10-CM

## 2022-09-28 DIAGNOSIS — R8290 Unspecified abnormal findings in urine: Secondary | ICD-10-CM

## 2022-09-28 DIAGNOSIS — E6609 Other obesity due to excess calories: Secondary | ICD-10-CM

## 2022-09-28 DIAGNOSIS — I700 Atherosclerosis of aorta: Secondary | ICD-10-CM

## 2022-09-28 MED ORDER — DULOXETINE HCL 60 MG PO CPEP: 60.0000 mg | ORAL_CAPSULE | Freq: Two times a day (BID) | ORAL | 1 refills | Status: DC

## 2022-09-28 MED ORDER — LOSARTAN POTASSIUM 100 MG PO TABS: 100.0000 mg | ORAL_TABLET | Freq: Every day | ORAL | 1 refills | Status: DC

## 2022-09-28 MED ORDER — AMLODIPINE BESYLATE 10 MG PO TABS: 10.0000 mg | ORAL_TABLET | Freq: Every day | ORAL | 1 refills | Status: DC

## 2022-09-28 NOTE — Patient Instructions (Addendum)
Please start taking Amlodipine 10 mg once daily. Continue taking Losartan 100 mg once daily.  Please continue to follow low salt diet and perform moderate exercise/walking at least 150 mins/week.

## 2022-09-28 NOTE — Progress Notes (Signed)
Established Patient Office Visit  Subjective:  Patient ID: Ryan Cain, male    DOB: 1955/11/09  Age: 67 y.o. MRN: [id number]  CC:  Chief Complaint  Patient presents with   Hypertension    F/u   Urinary Tract Infection    Pt. Requested a urine sample today. He is having a smell to urine and moderate pain in the groin area.     HPI Ryan Cain is a 67 y.o. male with past medical history of HTN, HLD, GAD and OA of knee who presents for f/u of his chronic medical conditions.  HTN: His BP was elevated today. BP was well-controlled with Losartan and Chlorthalidone, which was started in the last visit. But he had urinary frequency with chlorthalidone and he had to stop taking it.  Takes losartan 100 mg QD and amlodipine 5 mg QD regularly. Patient denies headache, dizziness, chest pain, dyspnea or palpitations.  GAD: Has been doing well with Cymbalta.  He had been more stressed at work and states that his anxiety was not controlled with Cymbalta 60 mg.  His dose of Cymbalta was increased to 60 mg BID, which has improved his symptoms.  He takes evening dose of Cymbalta only as needed now.  Denies anhedonia or SI.  HLD: His blood tests showed elevated cholesterol level. He has history of aortic atherosclerosis.  He was placed on Crestor, but had leg cramps with it.  He agrees to try it every other day.  He reports urinary frequency and foul smelling urine.  Denies any dysuria or hematuria currently.  Denies any fever, chills, nausea or vomiting currently.  Past Medical History:  Diagnosis Date   Arthritis    Asthma    as a child   Hyperlipidemia    Hypertension    Situational mixed anxiety and depressive disorder    Situational mixed anxiety and depressive disorder     Past Surgical History:  Procedure Laterality Date   COLONOSCOPY WITH PROPOFOL N/A 05/30/2021   Procedure: COLONOSCOPY WITH PROPOFOL;  Surgeon: Lanelle Bal, DO;  [facility]: [facility];  Service: Endoscopy;   Laterality: N/A;  1:15 / ASA 3   DENTAL SURGERY     POLYPECTOMY  05/30/2021   Procedure: POLYPECTOMY;  Surgeon: Lanelle Bal, DO;  [facility]: [facility];  Service: Endoscopy;;    Family History  Problem Relation Age of Onset   Arthritis Mother    Hypertension Mother    Hyperlipidemia Mother    Dementia Mother    Heart disease Father 8       heart attack   Heart disease Paternal Uncle     Social History   Socioeconomic History   Marital status: Married    Spouse name: Wynona Canes   Number of children: 1   Years of education: 13   Highest education level: Not on file  Occupational History   Occupation: Merchandiser, 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   Social Determinants of Health   Financial Resource Strain: Not on file  Food Insecurity: Not on file  Transportation Needs: Not on file  Physical Activity: Not on file  Stress: Not on file  Social Connections: Not on file  Intimate Partner Violence: Not on file    Outpatient Medications Prior to Visit  Medication Sig Dispense Refill   acetaminophen (TYLENOL) 650 MG CR tablet Take 650-1,300 mg by mouth every 8 (eight) hours as needed for pain.     Cholecalciferol (VITAMIN D3) 125 MCG (5000 UT) CAPS Take 5,000 Units by mouth daily.     fluticasone (FLONASE) 50 MCG/ACT nasal spray Place 2 sprays into both nostrils daily. 16 g 6   Multiple Vitamins-Minerals  (MULTIVITAMIN WITH MINERALS) tablet Take 1 tablet by mouth daily.     rosuvastatin (CRESTOR) 5 MG tablet Take 1 tablet (5 mg total) by mouth daily. 90 tablet 3   amLODipine (NORVASC) 5 MG tablet Take 1 tablet (5 mg total) by mouth daily. 90 tablet 0   DULoxetine (CYMBALTA) 60 MG capsule Take 1 capsule (60 mg total) by mouth 2 (two) times daily. 180 capsule 1   loratadine (CLARITIN) 10 MG tablet Take 10 mg by mouth daily. (Patient not taking: Reported on 09/28/2022)     Omega-3 Fatty Acids (FISH OIL PO) Take 2,000 mg by mouth in the morning and at bedtime. (Patient not taking: Reported on 09/28/2022)     OVER THE COUNTER MEDICATION Take 2 capsules by mouth daily. Beet Root     sildenafil (VIAGRA) 50 MG tablet Take 1 tablet (50 mg total) by mouth daily as needed for erectile dysfunction. (Patient not taking: Reported on 09/28/2022) 30 tablet 2   losartan (COZAAR) 100 MG tablet TAKE 1 TABLET BY MOUTH DAILY (Patient not taking: Reported on 09/28/2022) 30 tablet 0   No facility-administered medications prior to visit.    No Known Allergies  ROS Review of Systems  Constitutional:  Negative for chills and fever.  HENT:  Negative for congestion and sore throat.   Eyes:  Negative for pain and discharge.  Respiratory:  Negative for cough and shortness of breath.   Cardiovascular:  Negative for chest pain and palpitations.  Gastrointestinal:  Negative for constipation, diarrhea, nausea and vomiting.  Endocrine: Negative for polydipsia and polyuria.  Genitourinary:  Positive for frequency. Negative for dysuria and hematuria.  Musculoskeletal:  Positive for arthralgias (B/l knee). Negative for neck pain and neck stiffness.  Skin:  Negative for rash.  Neurological:  Negative for dizziness, weakness, numbness and headaches.  Psychiatric/Behavioral:  Negative for agitation and behavioral problems.       Objective:    Physical Exam Vitals reviewed.  Constitutional:      General: He is not in acute  distress.    Appearance: He is not diaphoretic.  HENT:     Head: Normocephalic and atraumatic.     Nose: No congestion.     Mouth/Throat:     Mouth: Mucous membranes are moist.     Pharynx: No oropharyngeal exudate.  Eyes:     General: No scleral icterus.    Extraocular Movements: Extraocular movements intact.  Cardiovascular:     Rate and Rhythm: Normal rate and regular rhythm.     Pulses: Normal pulses.     Heart sounds: Normal heart sounds. No murmur heard. Pulmonary:  Breath sounds: Normal breath sounds. No wheezing or rales.  Abdominal:     Palpations: Abdomen is soft.     Tenderness: There is no abdominal tenderness.  Musculoskeletal:     Cervical back: Neck supple. No tenderness.     Right lower leg: No edema.     Left lower leg: No edema.  Skin:    General: Skin is warm.     Findings: No rash.  Neurological:     General: No focal deficit present.     Mental Status: He is alert and oriented to person, place, and time.     Cranial Nerves: No cranial nerve deficit.     Sensory: No sensory deficit.     Motor: No weakness.  Psychiatric:        Mood and Affect: Mood normal.        Behavior: Behavior normal.     BP (!) 158/84 (BP Location: Left Arm)   Pulse 86   Ht 6' (1.829 m)   Wt 238 lb 3.2 oz (108 kg)   SpO2 96%   BMI 32.31 kg/m  Wt Readings from Last 3 Encounters:  09/28/22 238 lb 3.2 oz (108 kg)  06/25/22 237 lb (107.5 kg)  04/30/22 239 lb 9.6 oz (108.7 kg)    Lab Results  Component Value Date   TSH 0.830 04/09/2022   Lab Results  Component Value Date   WBC 4.2 01/10/2021   HGB 14.2 01/10/2021   HCT 41.0 01/10/2021   MCV 95 01/10/2021   PLT 184 01/10/2021   Lab Results  Component Value Date   NA 144 04/09/2022   K 3.9 04/09/2022   CO2 22 04/09/2022   GLUCOSE 98 04/09/2022   BUN 14 04/09/2022   CREATININE 1.07 04/09/2022   BILITOT 0.4 04/09/2022   ALKPHOS 57 04/09/2022   AST 22 04/09/2022   ALT 19 04/09/2022   PROT 6.5 04/09/2022    ALBUMIN 4.2 04/09/2022   CALCIUM 9.6 04/09/2022   ANIONGAP 10 10/01/2020   EGFR 76 04/09/2022   Lab Results  Component Value Date   CHOL 229 (H) 04/09/2022   Lab Results  Component Value Date   HDL 70 04/09/2022   Lab Results  Component Value Date   LDLCALC 137 (H) 04/09/2022   Lab Results  Component Value Date   TRIG 123 04/09/2022   Lab Results  Component Value Date   CHOLHDL 3.3 04/09/2022   Lab Results  Component Value Date   HGBA1C 5.3 04/09/2022      Assessment & Plan:   Problem List Items Addressed This Visit       Cardiovascular and Mediastinum   Essential hypertension - Primary    BP Readings from Last 1 Encounters:  09/28/22 (!) 158/84   Elevated today Uncontrolled now with Losartan 100 mg QD and Amlodipine 5 mg QD Increased dose of Amlodipine to 10 mg QD Had to DC Chlorthalidone 25 mg QD as he had urinary frequency Counseled for compliance with the medications Advised DASH diet and moderate exercise/walking, at least 150 mins/week      Relevant Medications   losartan (COZAAR) 100 MG tablet   amLODipine (NORVASC) 10 MG tablet   Atherosclerosis of abdominal aorta (HCC)    Incidental finding on imaging Checked lipid profile Added Crestor, needs to be compliant      Relevant Medications   losartan (COZAAR) 100 MG tablet   amLODipine (NORVASC) 10 MG tablet     Genitourinary   Acute cystitis  without hematuria    Checked UA Check urine culture Started empiric Bactrim Advised to maintain adequate hydration      Relevant Orders   UA/M w/rflx Culture, Routine (Completed)     Other   Obesity    BMI Readings from Last 3 Encounters:  09/28/22 32.31 kg/m  06/25/22 32.14 kg/m  04/30/22 32.50 kg/m   Has lost more than 40 lbs with diet modification Moderate exercise/walking advised      GAD (generalized anxiety disorder)    Well-controlled with Cymbalta 60 mg BID, takes evening dose only sometimes      Relevant Medications    DULoxetine (CYMBALTA) 60 MG capsule   Other Visit Diagnoses     Need for zoster vaccine       Relevant Orders   Varicella-zoster vaccine IM (Completed)   Encounter for immunization       Relevant Orders   Flu Vaccine Trivalent High Dose (Fluad) (Completed)       Meds ordered this encounter  Medications   losartan (COZAAR) 100 MG tablet    Sig: Take 1 tablet (100 mg total) by mouth daily.    Dispense:  90 tablet    Refill:  1   amLODipine (NORVASC) 10 MG tablet    Sig: Take 1 tablet (10 mg total) by mouth daily.    Dispense:  90 tablet    Refill:  1   DULoxetine (CYMBALTA) 60 MG capsule    Sig: Take 1 capsule (60 mg total) by mouth 2 (two) times daily.    Dispense:  180 capsule    Refill:  1    Follow-up: Return in about 14 weeks (around 01/04/2023) for Annual physical (after 01/04/23).    Anabel Halon, MD

## 2022-09-29 ENCOUNTER — Other Ambulatory Visit: Payer: Self-pay | Admitting: Internal Medicine

## 2022-09-29 DIAGNOSIS — N3000 Acute cystitis without hematuria: Secondary | ICD-10-CM | POA: Insufficient documentation

## 2022-09-29 MED ORDER — SULFAMETHOXAZOLE-TRIMETHOPRIM 800-160 MG PO TABS: 1.0000 | ORAL_TABLET | Freq: Two times a day (BID) | ORAL | 0 refills | Status: DC

## 2022-10-02 NOTE — Assessment & Plan Note (Addendum)
BP Readings from Last 1 Encounters:  09/28/22 (!) 158/84   Elevated today Uncontrolled now with Losartan 100 mg QD and Amlodipine 5 mg QD Increased dose of Amlodipine to 10 mg QD Had to DC Chlorthalidone 25 mg QD as he had urinary frequency Counseled for compliance with the medications Advised DASH diet and moderate exercise/walking, at least 150 mins/week

## 2022-10-02 NOTE — Assessment & Plan Note (Signed)
Incidental finding on imaging Checked lipid profile Added Crestor, needs to be compliant

## 2022-10-02 NOTE — Assessment & Plan Note (Addendum)
Well-controlled with Cymbalta 60 mg BID, takes evening dose only sometimes

## 2022-10-02 NOTE — Assessment & Plan Note (Signed)
BMI Readings from Last 3 Encounters:  09/28/22 32.31 kg/m  06/25/22 32.14 kg/m  04/30/22 32.50 kg/m   Has lost more than 40 lbs with diet modification Moderate exercise/walking advised

## 2022-10-02 NOTE — Assessment & Plan Note (Signed)
Checked UA Check urine culture Started empiric Bactrim Advised to maintain adequate hydration

## 2022-10-03 LAB — UA/M W/RFLX CULTURE, ROUTINE
Bilirubin, UA: NEGATIVE
Glucose, UA: NEGATIVE
Ketones, UA: NEGATIVE
Nitrite, UA: POSITIVE — AB
Specific Gravity, UA: 1.024 (ref 1.005–1.030)
Urobilinogen, Ur: 1 mg/dL (ref 0.2–1.0)
pH, UA: 6 (ref 5.0–7.5)

## 2022-10-03 LAB — MICROSCOPIC EXAMINATION
Casts: NONE SEEN /LPF
WBC, UA: >30 /HPF — AB (ref 0–5)

## 2022-10-03 LAB — URINE CULTURE, REFLEX

## 2022-11-05 IMAGING — CT CT ABD-PELV W/ CM
2 of 5 series · 15 of 46 positions shown, 17 images · IV contrast (Omnipaque or Isovue)
Comparison: None.

CLINICAL DATA: Abdominal abscess/infection. Right lower quadrant
abdominal pain and right low back pain. Scrotal swelling.

EXAM:
CT ABDOMEN AND PELVIS WITH CONTRAST
TECHNIQUE: Multidetector CT imaging of the abdomen and pelvis was performed
using the standard protocol following bolus administration of
intravenous contrast.
CONTRAST:  80mL OMNIPAQUE IOHEXOL 350 MG/ML SOLN

[Series 2: axial st · axial · 0.84mm/px · z∈[+831,+1436]mm · 12 of 137 slices shown, 14 images]
[im 8/137  soft-tissue]
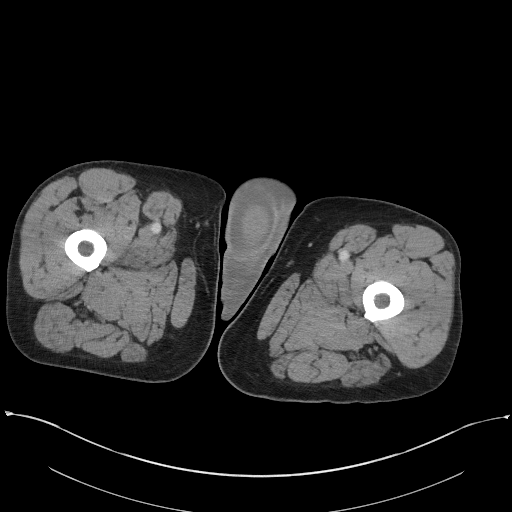
[im 8/137  bone]
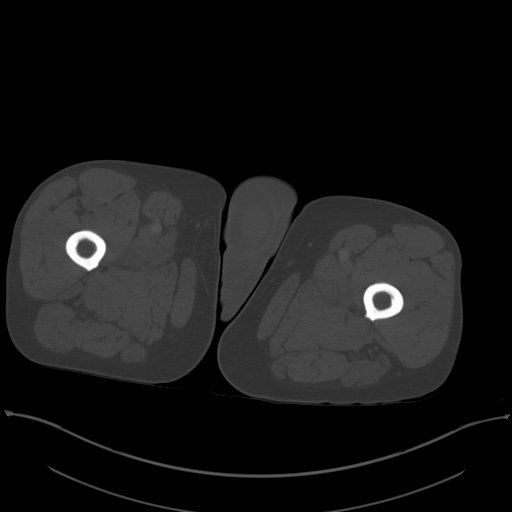
[im 23/137  soft-tissue]
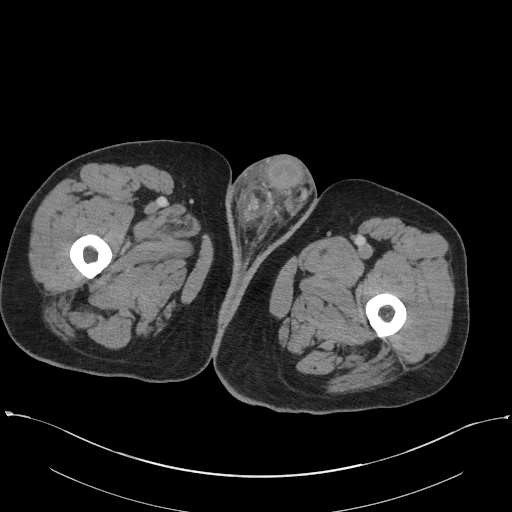
[im 31/137  soft-tissue]
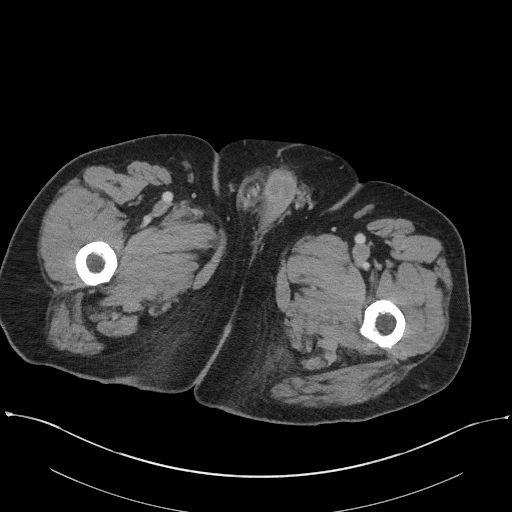
[im 38/137  soft-tissue]
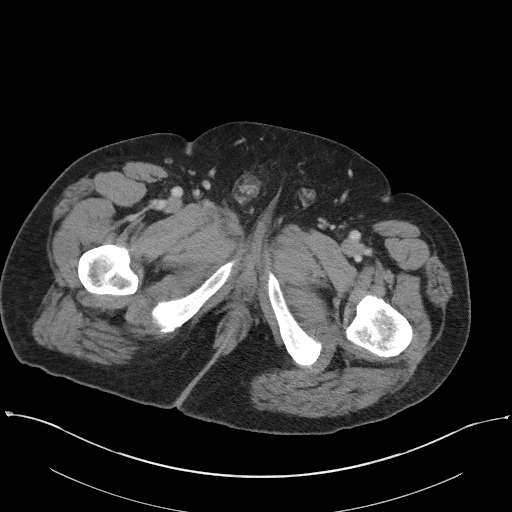
[im 53/137  soft-tissue]
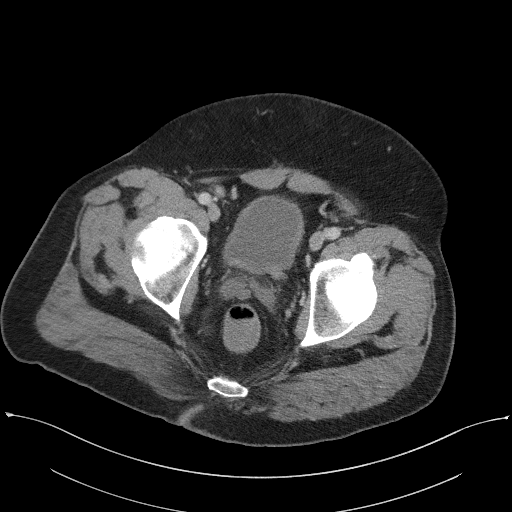
[im 61/137  soft-tissue]
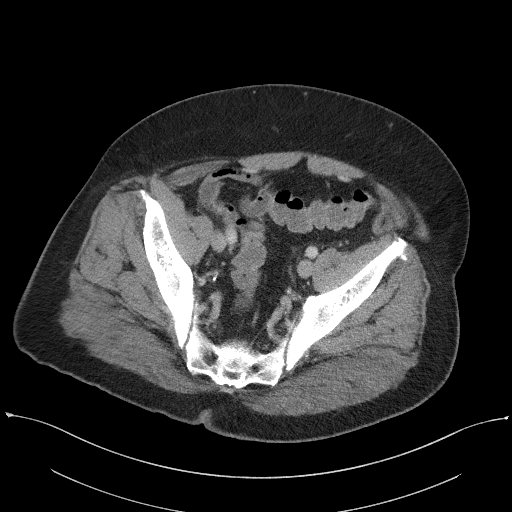
[im 76/137  soft-tissue]
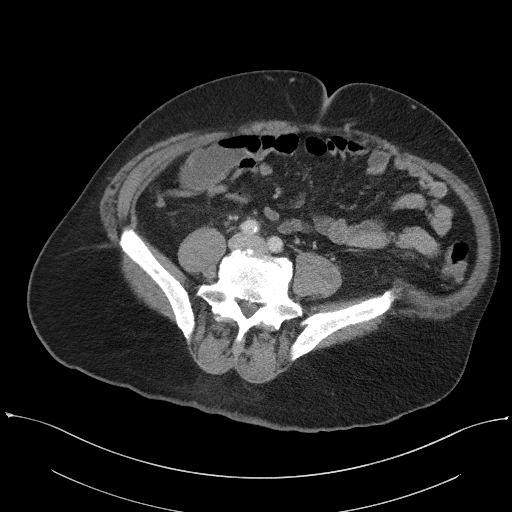
[im 84/137  soft-tissue]
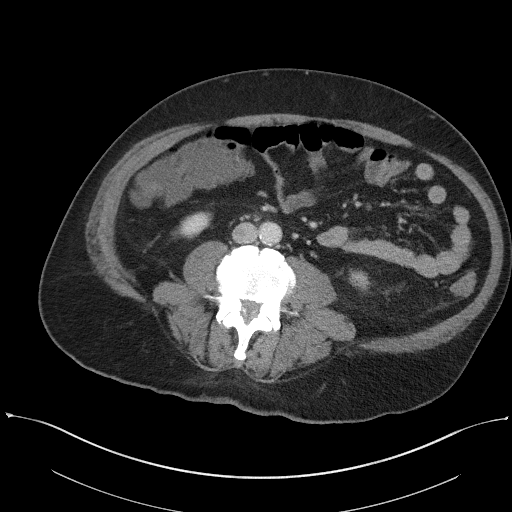
[im 99/137  soft-tissue]
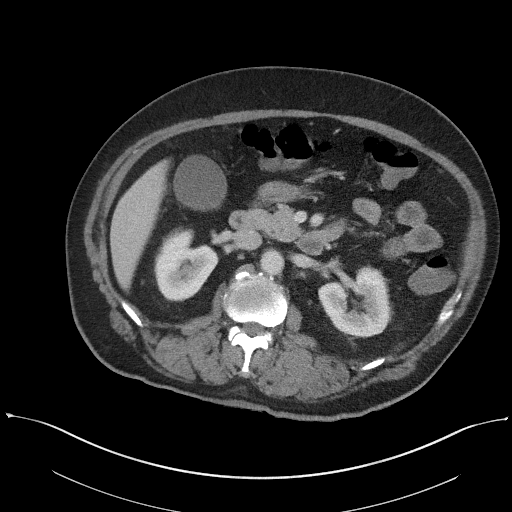
[im 99/137  bone]
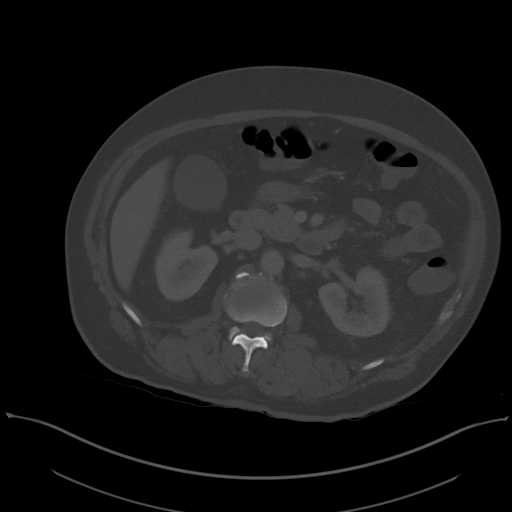
[im 106/137  soft-tissue]
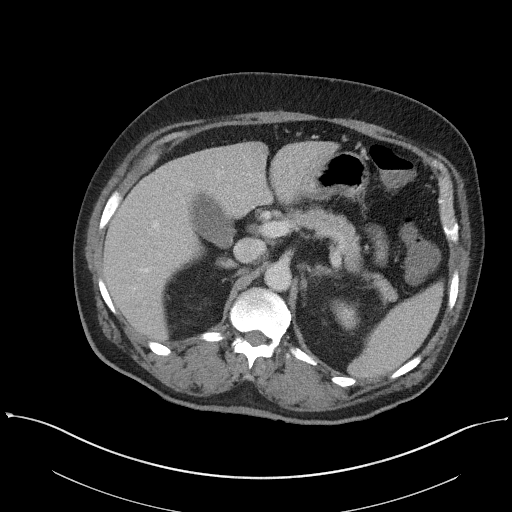
[im 114/137  soft-tissue]
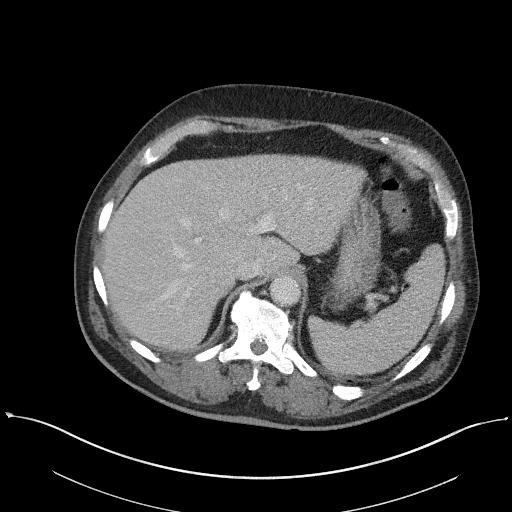
[im 129/137  soft-tissue]
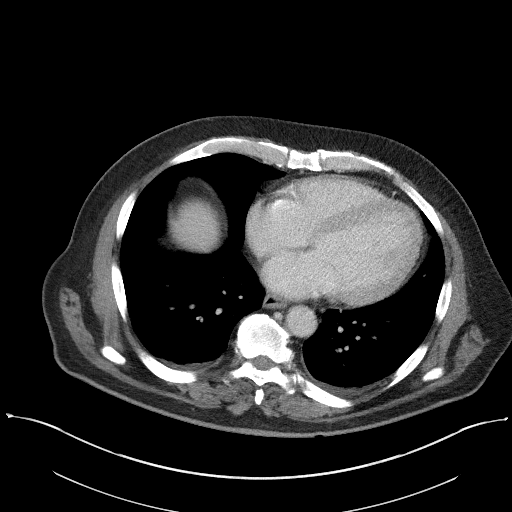

[Series 6: coronal st · coronal · 0.92mm/px · 3 of 117 slices shown]
[im 39/117  soft-tissue]
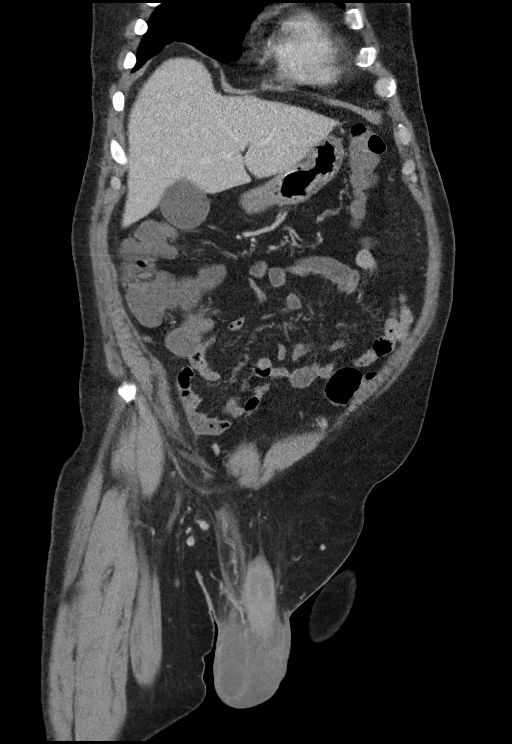
[im 52/117  soft-tissue]
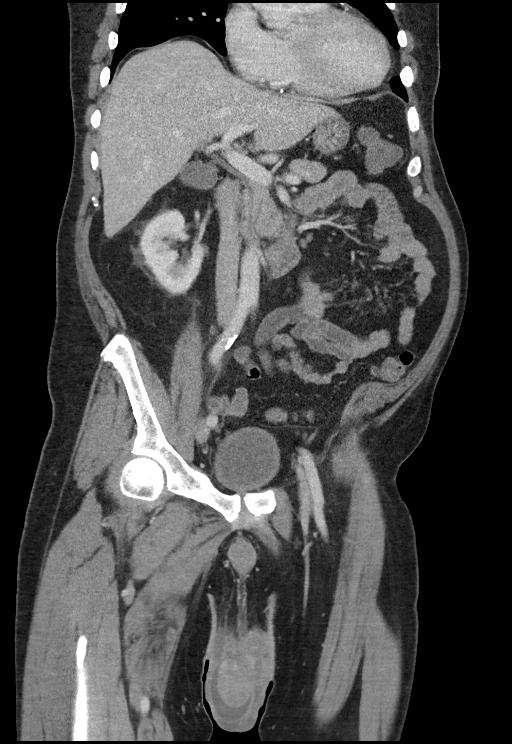
[im 65/117  soft-tissue]
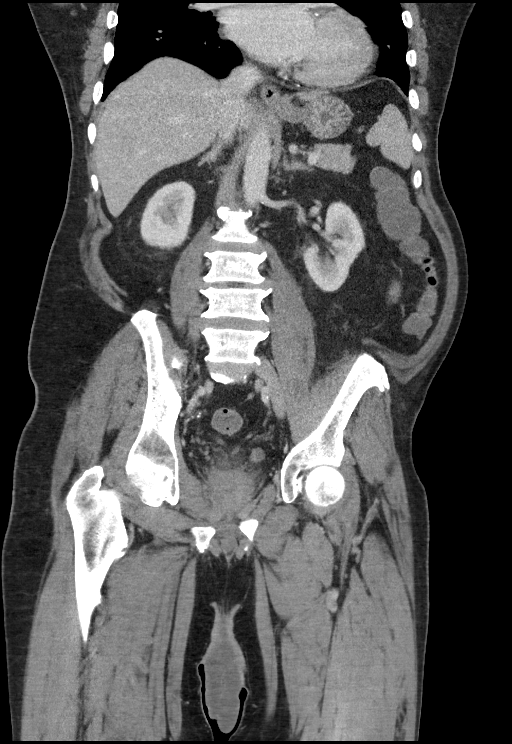

[15 of 46 positions shown; findings below may reference images not displayed]

FINDINGS: Lower chest: Mild subsegmental atelectasis or scarring medially in
the right middle lobe. Left anterior descending, right, and
circumflex coronary artery atherosclerotic calcification. Trace
pleural effusions bilaterally. Mild cardiomegaly.

Hepatobiliary: Three small hypodense lesions in the left hepatic
lobe measuring up to 0.4 cm in diameter are technically nonspecific
although statistically likely to be benign. Gallbladder
unremarkable. No biliary dilatation.

Pancreas: Unremarkable

Spleen: Unremarkable

Adrenals/Urinary Tract: Unremarkable

Stomach/Bowel: Air-levels in the distal colon suggest diarrheal
process. No dilated bowel identified. Normal appendix.

Vascular/Lymphatic: Atherosclerosis is present, including aortoiliac
atherosclerotic disease.

Reproductive: Bilateral scrotal hydroceles are present, right
greater than left, along with subcutaneous edema and thickening in
the scrotum and penis. There is relative hypodensity of the left
testicle compared to the right, correlation with the scrotal
sonogram is recommended.

Other: No supplemental non-categorized findings.

Musculoskeletal: Old healed fractures of the left lower ribs noted
laterally. Lumbar spondylosis and degenerative disc disease causing
bilateral foraminal impingement at L3-4 and L4-5.
IMPRESSION: 1. Bilateral scrotal hydroceles with subcutaneous edema and
thickening in the scrotum and penis. There is also some relative
hypodensity of the left testicle as compared to the right.
Correlation with scrotal ultrasound recommended.
2.  Aortic Atherosclerosis ([id number]).  Coronary atherosclerosis.
3. Air fluid levels in the distal colon suggest diarrheal process.
4. Lumbar impingement at L3-4 and L4-5.

## 2022-11-05 IMAGING — US US SCROTUM W/ DOPPLER COMPLETE
1 series · 14 of 25 positions shown · non-contrast
Comparison: None.

CLINICAL DATA: Sudden onset right testicular enlargement

EXAM:
SCROTAL ULTRASOUND
DOPPLER ULTRASOUND OF THE TESTICLES
TECHNIQUE: Complete ultrasound examination of the testicles, epididymis, and
other scrotal structures was performed. Color and spectral Doppler
ultrasound were also utilized to evaluate blood flow to the
testicles.

[Series 1: us scrotum w/doppler · 14 of 79 slices shown]
[im 1/79]
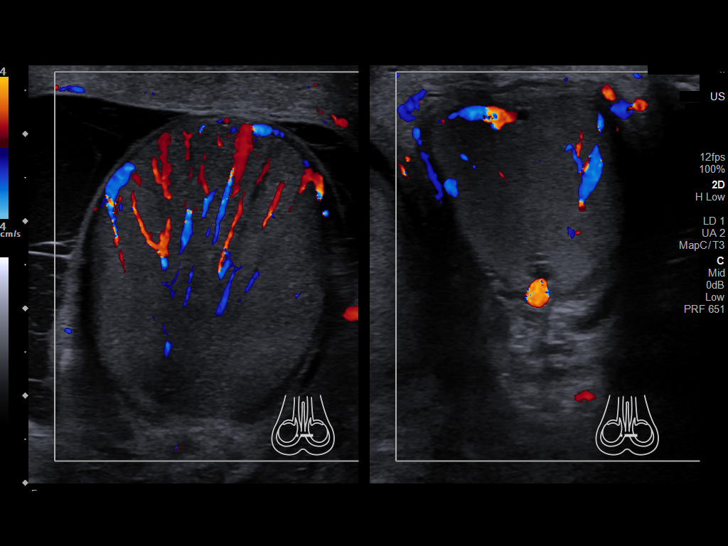
[im 7/79]
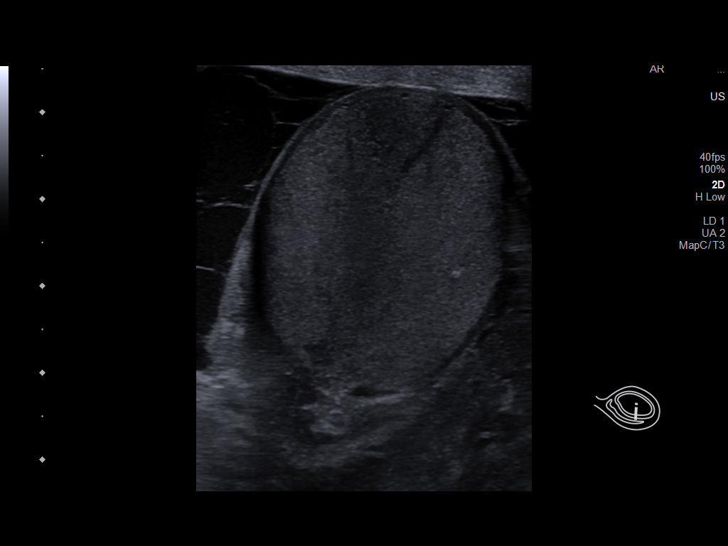
[im 14/79]
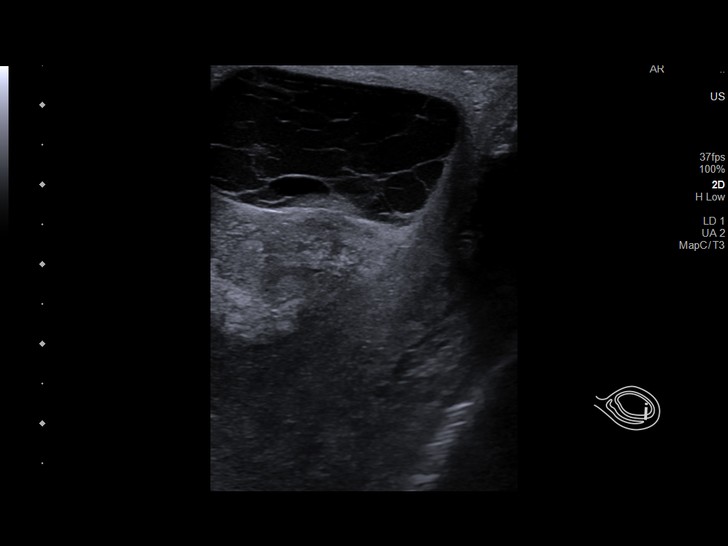
[im 20/79]
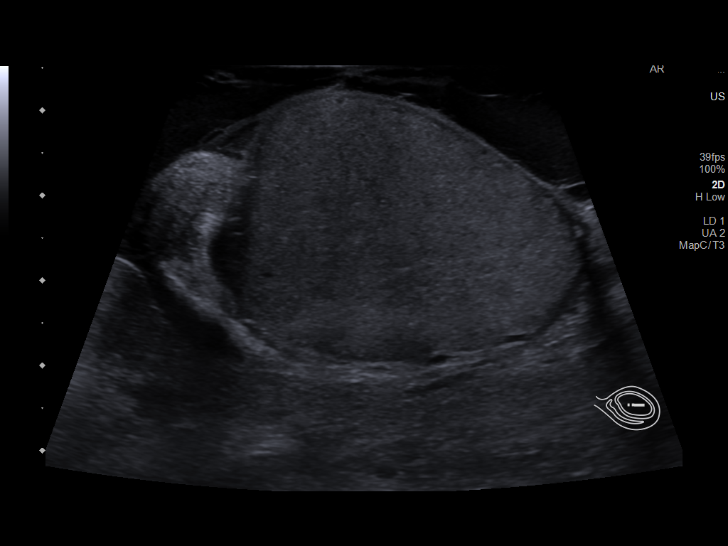
[im 27/79]
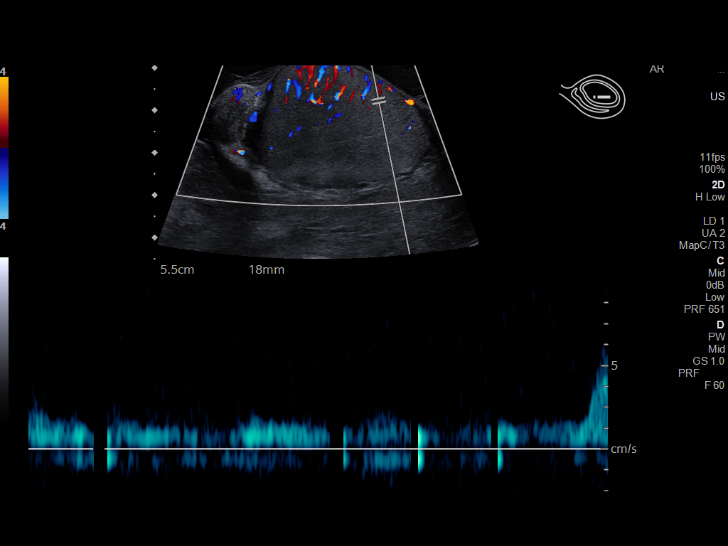
[im 30/79]
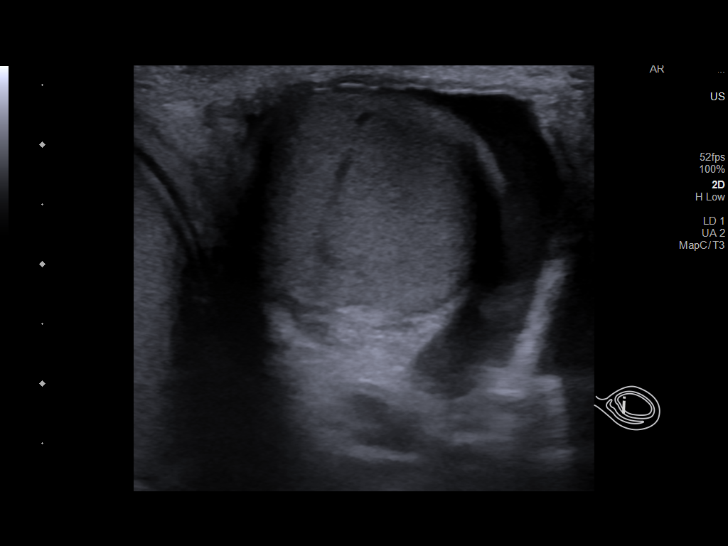
[im 36/79]
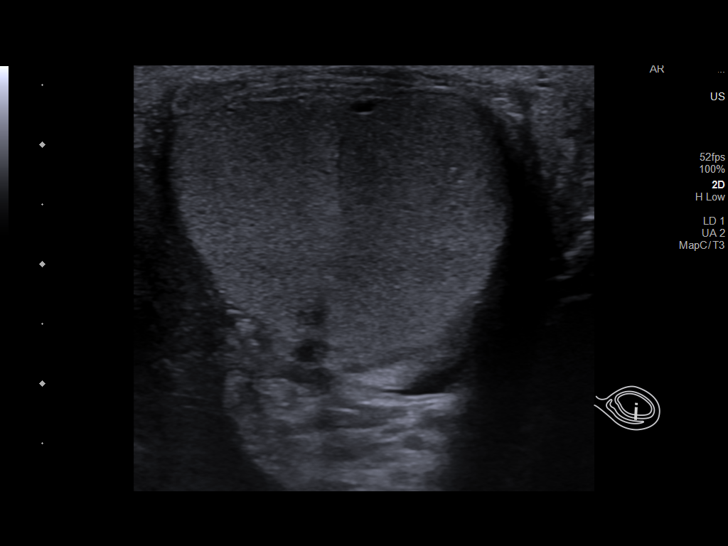
[im 43/79]
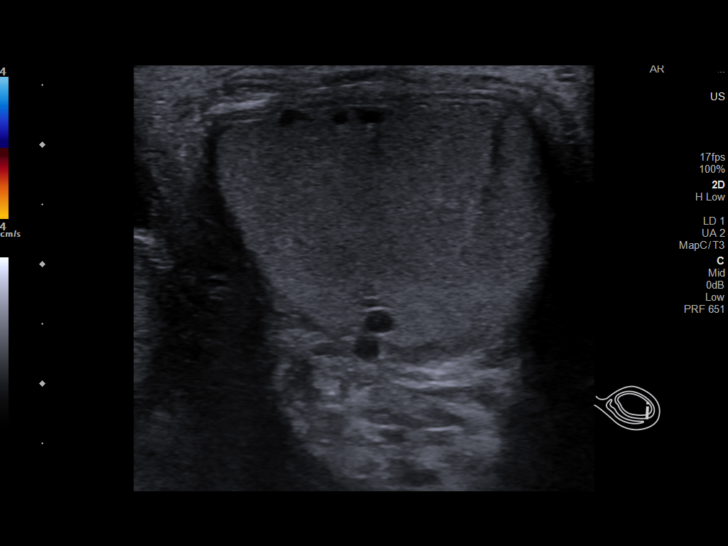
[im 49/79]
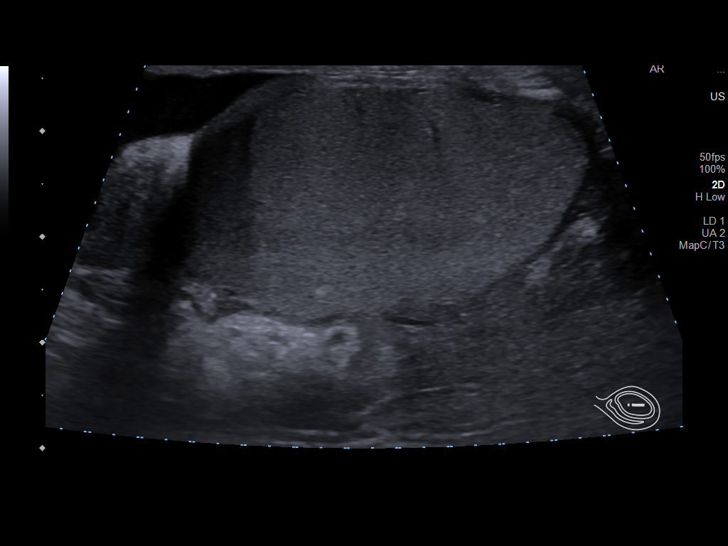
[im 53/79]
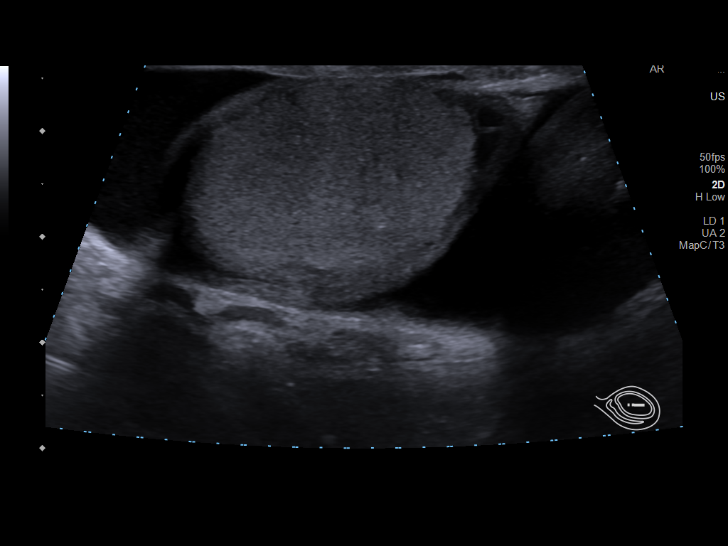
[im 59/79]
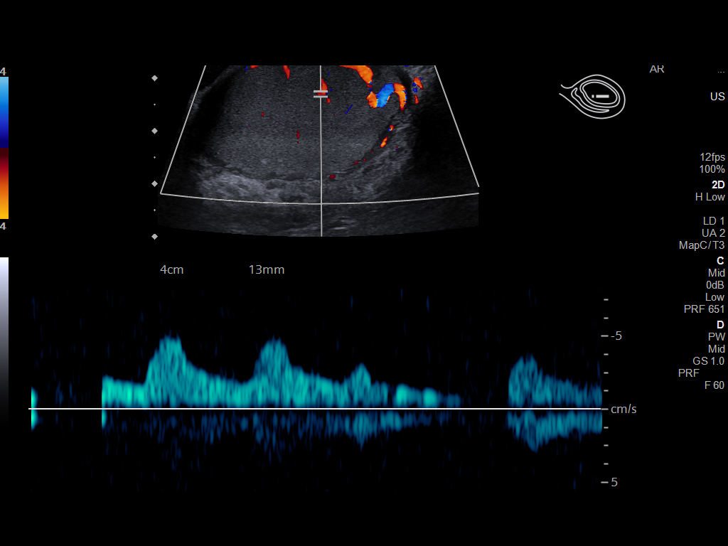
[im 66/79]
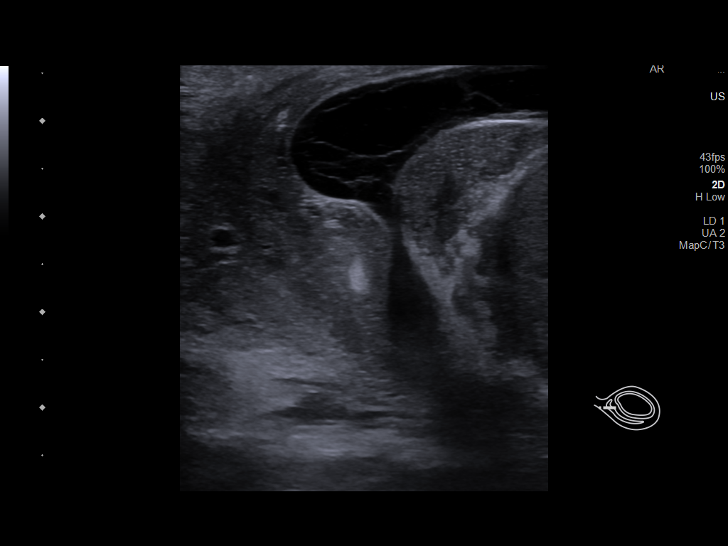
[im 72/79]
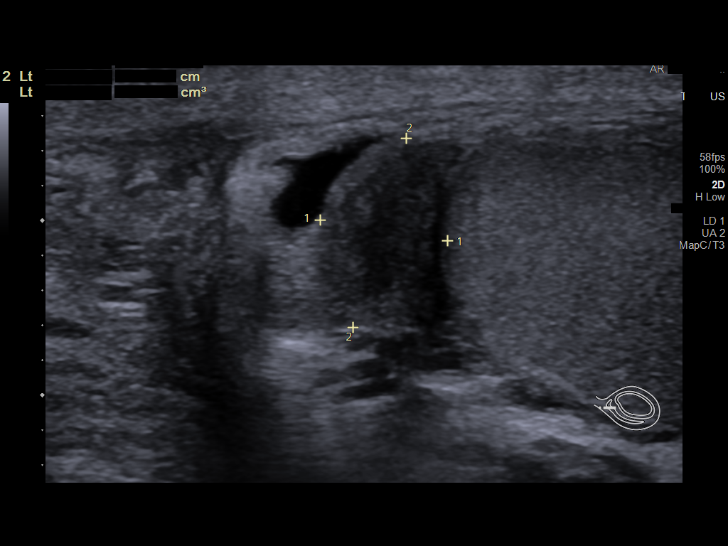
[im 79/79]
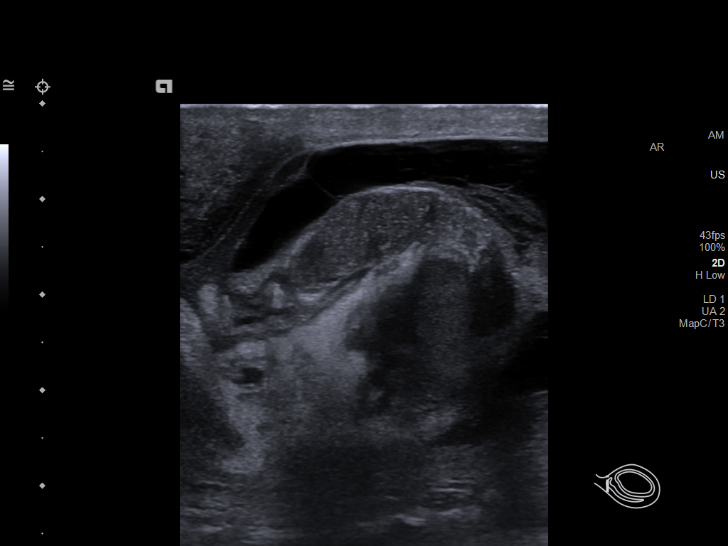

[14 of 25 positions shown; findings below may reference images not displayed]

FINDINGS: Right testicle

Measurements: 4.1 cm x 2.8 cm x 2.8 cm. No mass or microlithiasis
visualized.

Left testicle

Measurements: 4.3 cm x 2.4 cm x 2.8 cm. There is a 0.3 cm cyst in
the left testicle. No other mass or microlithiasis visualized.

Right epididymis:  Normal in size and appearance.

Left epididymis:  Normal in size and appearance.

Hydrocele: There is a complex right hydrocele and smaller simple
appearing left hydrocele.

Varicocele:  None visualized.

Pulsed Doppler interrogation of both testes demonstrates normal low
resistance arterial and venous waveforms bilaterally.
IMPRESSION: 1. Complex right hydrocele and smaller simple appearing left
hydrocele.
2. No evidence of torsion.

## 2023-01-05 ENCOUNTER — Encounter: Payer: Self-pay | Admitting: Internal Medicine

## 2023-01-05 ENCOUNTER — Ambulatory Visit (INDEPENDENT_AMBULATORY_CARE_PROVIDER_SITE_OTHER): Payer: PRIVATE HEALTH INSURANCE | Admitting: Internal Medicine

## 2023-01-05 VITALS — BP 138/78 | HR 68 | Ht 72.0 in | Wt 240.2 lb

## 2023-01-05 DIAGNOSIS — I10 Essential (primary) hypertension: Secondary | ICD-10-CM | POA: Diagnosis not present

## 2023-01-05 DIAGNOSIS — F331 Major depressive disorder, recurrent, moderate: Secondary | ICD-10-CM | POA: Diagnosis not present

## 2023-01-05 DIAGNOSIS — F411 Generalized anxiety disorder: Secondary | ICD-10-CM

## 2023-01-05 DIAGNOSIS — Z23 Encounter for immunization: Secondary | ICD-10-CM

## 2023-01-05 DIAGNOSIS — E559 Vitamin D deficiency, unspecified: Secondary | ICD-10-CM

## 2023-01-05 DIAGNOSIS — E782 Mixed hyperlipidemia: Secondary | ICD-10-CM

## 2023-01-05 DIAGNOSIS — Z125 Encounter for screening for malignant neoplasm of prostate: Secondary | ICD-10-CM

## 2023-01-05 DIAGNOSIS — R739 Hyperglycemia, unspecified: Secondary | ICD-10-CM

## 2023-01-05 DIAGNOSIS — Z0001 Encounter for general adult medical examination with abnormal findings: Secondary | ICD-10-CM

## 2023-01-05 MED ORDER — LOSARTAN POTASSIUM 100 MG PO TABS: 100.0000 mg | ORAL_TABLET | Freq: Every day | ORAL | 1 refills | Status: DC

## 2023-01-05 MED ORDER — AMLODIPINE BESYLATE 10 MG PO TABS: 10.0000 mg | ORAL_TABLET | Freq: Every day | ORAL | 1 refills | Status: DC

## 2023-01-05 MED ORDER — ROSUVASTATIN CALCIUM 5 MG PO TABS: 5.0000 mg | ORAL_TABLET | Freq: Every day | ORAL | 3 refills | Status: DC

## 2023-01-05 NOTE — Assessment & Plan Note (Signed)
Well-controlled with Cymbalta 60 mg BID, takes evening dose only sometimes

## 2023-01-05 NOTE — Assessment & Plan Note (Addendum)
Check lipid profile after 3-4 months Added Crestor, but needs to take it, agrees to try it again Advised to take CoQ10 100 mg QD for leg cramps

## 2023-01-05 NOTE — Assessment & Plan Note (Addendum)
BP Readings from Last 1 Encounters:  01/05/23 138/78   Well-controlled with Losartan 100 mg QD and Amlodipine 10 mg QD Counseled for compliance with the medications Advised DASH diet and moderate exercise/walking, at least 150 mins/week

## 2023-01-05 NOTE — Assessment & Plan Note (Signed)
Ordered PSA after discussing its limitations for prostate cancer screening, including false positive results leading to additional investigations. 

## 2023-01-05 NOTE — Assessment & Plan Note (Signed)
Flowsheet Row Office Visit from 09/28/2022 in [facility]  PHQ-9 Total Score 11      Worse recently due to work-related stress On Cymbalta 60 mg BID, continue for now as he states that it is improving

## 2023-01-05 NOTE — Assessment & Plan Note (Signed)
 Physical exam as documented. Counseling done  re healthy lifestyle involving commitment to 150 minutes exercise per week, heart healthy diet, and attaining healthy weight.The importance of adequate sleep also discussed. Immunization and cancer screening needs are specifically addressed at this visit.

## 2023-01-05 NOTE — Progress Notes (Signed)
Established Patient Office Visit  Subjective:  Patient ID: Ryan Cain, male    DOB: 25-Nov-1955  Age: 67 y.o. MRN: [id number]  CC:  Chief Complaint  Patient presents with   Annual Exam    HPI Ryan Cain is a 67 y.o. male with past medical history of HTN, HLD, GAD and OA of knee who presents for annual physical.  HTN: His BP was wnl today. Takes losartan 100 mg QD and amlodipine 10 mg QD regularly. Patient denies headache, dizziness, chest pain, dyspnea or palpitations.  GAD: Has been doing well with Cymbalta.  He had been more stressed at work. His dose of Cymbalta was increased to 60 mg BID, which has improved his symptoms.  He takes evening dose of Cymbalta only as needed now.  Denies anhedonia or SI.  HLD: His blood tests showed elevated cholesterol level. He has history of aortic atherosclerosis.  He was placed on Crestor, but had leg cramps with it.  He agrees to try it with CoQ10 supplement.  Past Medical History:  Diagnosis Date   Arthritis    Asthma    as a child   Hyperlipidemia    Hypertension    Situational mixed anxiety and depressive disorder    Situational mixed anxiety and depressive disorder     Past Surgical History:  Procedure Laterality Date   COLONOSCOPY WITH PROPOFOL N/A 05/30/2021   Procedure: COLONOSCOPY WITH PROPOFOL;  Surgeon: Lanelle Bal, DO;  [facility]: [facility];  Service: Endoscopy;  Laterality: N/A;  1:15 / ASA 3   DENTAL SURGERY     POLYPECTOMY  05/30/2021   Procedure: POLYPECTOMY;  Surgeon: Lanelle Bal, DO;  Location: [facility];  Service: Endoscopy;;    Family History  Problem Relation Age of Onset   Arthritis Mother    Hypertension Mother    Hyperlipidemia Mother    Dementia Mother    Heart disease Father 61       heart attack   Heart disease Paternal Uncle     Social History   Socioeconomic History   Marital status: Married    Spouse name: Wynona Canes   Number of children: 1   Years of education: 13    Highest education level: Not on file  Occupational History   Occupation: Merchandiser, 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  Tobacco Use   Smoking status: Never   Smokeless tobacco: Never  Vaping Use   Vaping status: Never Used  Substance and Sexual Activity   Alcohol use: Yes    Alcohol/week: 6.0 standard drinks of alcohol    Types: 6 Shots of liquor per week    Comment: 3x a week bourbon   Drug use: No   Sexual activity: Yes    Birth control/protection: Post-menopausal  Other Topics Concern   Not on file  Social History Narrative   Lives with wife Wynona Canes who has spina bifida- affected mildly.Married over 40 years.   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   Social Drivers of Corporate investment banker Strain: Not on file  Food Insecurity: Not on file  Transportation Needs: Not on file  Physical Activity: Not on file  Stress: Not on file  Social Connections: Not on file  Intimate Partner Violence: Not on file    Outpatient Medications Prior to Visit  Medication Sig Dispense Refill   acetaminophen (TYLENOL) 650 MG CR tablet Take 650-1,300 mg by mouth every 8 (eight) hours as needed for  pain.     Cholecalciferol (VITAMIN D3) 125 MCG (5000 UT) CAPS Take 5,000 Units by mouth daily.     DULoxetine (CYMBALTA) 60 MG capsule Take 1 capsule (60 mg total) by mouth 2 (two) times daily. 180 capsule 1   fluticasone (FLONASE) 50 MCG/ACT nasal spray Place 2 sprays into both nostrils daily. 16 g 6   loratadine (CLARITIN) 10 MG tablet Take 10 mg by mouth daily. (Patient not taking: Reported on 09/28/2022)     Multiple Vitamins-Minerals (MULTIVITAMIN WITH MINERALS) tablet Take 1 tablet by mouth daily.     Omega-3 Fatty Acids (FISH OIL PO) Take 2,000 mg by mouth in the morning and at bedtime. (Patient not taking: Reported on 09/28/2022)     OVER THE COUNTER MEDICATION Take 2 capsules by mouth daily. Beet Root     sildenafil (VIAGRA) 50 MG tablet Take 1 tablet (50 mg total) by mouth  daily as needed for erectile dysfunction. (Patient not taking: Reported on 09/28/2022) 30 tablet 2   amLODipine (NORVASC) 10 MG tablet Take 1 tablet (10 mg total) by mouth daily. 90 tablet 1   losartan (COZAAR) 100 MG tablet Take 1 tablet (100 mg total) by mouth daily. 90 tablet 1   rosuvastatin (CRESTOR) 5 MG tablet Take 1 tablet (5 mg total) by mouth daily. 90 tablet 3   sulfamethoxazole-trimethoprim (BACTRIM DS) 800-160 MG tablet Take 1 tablet by mouth 2 (two) times daily. 10 tablet 0   No facility-administered medications prior to visit.    No Known Allergies  ROS Review of Systems  Constitutional:  Negative for chills and fever.  HENT:  Negative for congestion and sore throat.   Eyes:  Negative for pain and discharge.  Respiratory:  Negative for cough and shortness of breath.   Cardiovascular:  Negative for chest pain and palpitations.  Gastrointestinal:  Negative for constipation, diarrhea, nausea and vomiting.  Endocrine: Negative for polydipsia and polyuria.  Genitourinary:  Positive for frequency. Negative for dysuria and hematuria.  Musculoskeletal:  Positive for arthralgias (B/l knee). Negative for neck pain and neck stiffness.  Skin:  Negative for rash.  Neurological:  Negative for dizziness, weakness, numbness and headaches.  Psychiatric/Behavioral:  Negative for agitation and behavioral problems.       Objective:    Physical Exam Vitals reviewed.  Constitutional:      General: He is not in acute distress.    Appearance: He is not diaphoretic.  HENT:     Head: Normocephalic and atraumatic.     Nose: No congestion.     Mouth/Throat:     Mouth: Mucous membranes are moist.     Pharynx: No oropharyngeal exudate.  Eyes:     General: No scleral icterus.    Extraocular Movements: Extraocular movements intact.  Cardiovascular:     Rate and Rhythm: Normal rate and regular rhythm.     Pulses: Normal pulses.     Heart sounds: Normal heart sounds. No murmur  heard. Pulmonary:     Breath sounds: Normal breath sounds. No wheezing or rales.  Abdominal:     Palpations: Abdomen is soft.     Tenderness: There is no abdominal tenderness.  Musculoskeletal:     Cervical back: Neck supple. No tenderness.     Right lower leg: No edema.     Left lower leg: No edema.  Skin:    General: Skin is warm.     Findings: No rash.  Neurological:     General: No focal deficit present.  Mental Status: He is alert and oriented to person, place, and time.     Cranial Nerves: No cranial nerve deficit.     Sensory: No sensory deficit.     Motor: No weakness.  Psychiatric:        Mood and Affect: Mood normal.        Behavior: Behavior normal.     BP 138/78 (BP Location: Left Arm)   Pulse 68   Ht 6' (1.829 m)   Wt 240 lb 3.2 oz (109 kg)   SpO2 98%   BMI 32.58 kg/m  Wt Readings from Last 3 Encounters:  01/05/23 240 lb 3.2 oz (109 kg)  09/28/22 238 lb 3.2 oz (108 kg)  06/25/22 237 lb (107.5 kg)    Lab Results  Component Value Date   TSH 0.830 04/09/2022   Lab Results  Component Value Date   WBC 4.2 01/10/2021   HGB 14.2 01/10/2021   HCT 41.0 01/10/2021   MCV 95 01/10/2021   PLT 184 01/10/2021   Lab Results  Component Value Date   NA 144 04/09/2022   K 3.9 04/09/2022   CO2 22 04/09/2022   GLUCOSE 98 04/09/2022   BUN 14 04/09/2022   CREATININE 1.07 04/09/2022   BILITOT 0.4 04/09/2022   ALKPHOS 57 04/09/2022   AST 22 04/09/2022   ALT 19 04/09/2022   PROT 6.5 04/09/2022   ALBUMIN 4.2 04/09/2022   CALCIUM 9.6 04/09/2022   ANIONGAP 10 10/01/2020   EGFR 76 04/09/2022   Lab Results  Component Value Date   CHOL 229 (H) 04/09/2022   Lab Results  Component Value Date   HDL 70 04/09/2022   Lab Results  Component Value Date   LDLCALC 137 (H) 04/09/2022   Lab Results  Component Value Date   TRIG 123 04/09/2022   Lab Results  Component Value Date   CHOLHDL 3.3 04/09/2022   Lab Results  Component Value Date   HGBA1C 5.3  04/09/2022      Assessment & Plan:   Problem List Items Addressed This Visit       Cardiovascular and Mediastinum   Essential hypertension   BP Readings from Last 1 Encounters:  01/05/23 138/78   Well-controlled with Losartan 100 mg QD and Amlodipine 10 mg QD Counseled for compliance with the medications Advised DASH diet and moderate exercise/walking, at least 150 mins/week      Relevant Medications   losartan (COZAAR) 100 MG tablet   amLODipine (NORVASC) 10 MG tablet   rosuvastatin (CRESTOR) 5 MG tablet   Other Relevant Orders   TSH   CMP14+EGFR   CBC with Differential/Platelet     Other   HLD (hyperlipidemia)   Check lipid profile after 3-4 months Added Crestor, but needs to take it, agrees to try it again Advised to take CoQ10 100 mg QD for leg cramps      Relevant Medications   losartan (COZAAR) 100 MG tablet   amLODipine (NORVASC) 10 MG tablet   rosuvastatin (CRESTOR) 5 MG tablet   Other Relevant Orders   Lipid panel   GAD (generalized anxiety disorder)   Well-controlled with Cymbalta 60 mg BID, takes evening dose only sometimes      Relevant Orders   TSH   CMP14+EGFR   CBC with Differential/Platelet   Encounter for general adult medical examination with abnormal findings - Primary   Physical exam as documented. Counseling done  re healthy lifestyle involving commitment to 150 minutes exercise per week, heart healthy  diet, and attaining healthy weight.The importance of adequate sleep also discussed. Immunization and cancer screening needs are specifically addressed at this visit.      Moderate episode of recurrent major depressive disorder [facility])   Flowsheet Row Office Visit from 09/28/2022 in [facility]  PHQ-9 Total Score 11      Worse recently due to work-related stress On Cymbalta 60 mg BID, continue for now as he states that it is improving      Prostate cancer screening   Ordered PSA after discussing its limitations  for prostate cancer screening, including false positive results leading to additional investigations.      Relevant Orders   PSA   Other Visit Diagnoses       Hyperglycemia       Relevant Orders   Hemoglobin A1c     Vitamin D deficiency       Relevant Orders   VITAMIN D 25 Hydroxy (Vit-D Deficiency, Fractures)       Meds ordered this encounter  Medications   losartan (COZAAR) 100 MG tablet    Sig: Take 1 tablet (100 mg total) by mouth daily.    Dispense:  90 tablet    Refill:  1   amLODipine (NORVASC) 10 MG tablet    Sig: Take 1 tablet (10 mg total) by mouth daily.    Dispense:  90 tablet    Refill:  1   rosuvastatin (CRESTOR) 5 MG tablet    Sig: Take 1 tablet (5 mg total) by mouth daily.    Dispense:  90 tablet    Refill:  3    Follow-up: Return in about 4 months (around 05/06/2023) for HTN.    Anabel Halon, MD

## 2023-01-05 NOTE — Patient Instructions (Signed)
Please start taking Rosuvastatin for cholesterol.  Continue Losartan 100 mg and Amlodipine 10 mg once daily for blood pressure.  Please continue to take medications as prescribed.  Please continue to follow low salt diet and perform moderate exercise/walking at least 150 mins/week.  Please get fasting blood tests done before the next visit.

## 2023-02-07 ENCOUNTER — Other Ambulatory Visit: Payer: Self-pay | Admitting: Internal Medicine

## 2023-02-07 DIAGNOSIS — F411 Generalized anxiety disorder: Secondary | ICD-10-CM

## 2023-03-20 ENCOUNTER — Other Ambulatory Visit: Payer: Self-pay | Admitting: Internal Medicine

## 2023-03-20 DIAGNOSIS — I10 Essential (primary) hypertension: Secondary | ICD-10-CM

## 2023-04-26 ENCOUNTER — Ambulatory Visit: Payer: PRIVATE HEALTH INSURANCE

## 2023-05-05 ENCOUNTER — Ambulatory Visit (INDEPENDENT_AMBULATORY_CARE_PROVIDER_SITE_OTHER): Payer: PRIVATE HEALTH INSURANCE | Admitting: Internal Medicine

## 2023-05-05 ENCOUNTER — Encounter: Payer: Self-pay | Admitting: Internal Medicine

## 2023-05-05 VITALS — BP 138/82 | HR 92 | Ht 72.0 in | Wt 242.0 lb

## 2023-05-05 DIAGNOSIS — M170 Bilateral primary osteoarthritis of knee: Secondary | ICD-10-CM

## 2023-05-05 DIAGNOSIS — R739 Hyperglycemia, unspecified: Secondary | ICD-10-CM | POA: Diagnosis not present

## 2023-05-05 DIAGNOSIS — E782 Mixed hyperlipidemia: Secondary | ICD-10-CM

## 2023-05-05 DIAGNOSIS — F411 Generalized anxiety disorder: Secondary | ICD-10-CM | POA: Diagnosis not present

## 2023-05-05 DIAGNOSIS — Z125 Encounter for screening for malignant neoplasm of prostate: Secondary | ICD-10-CM | POA: Diagnosis not present

## 2023-05-05 DIAGNOSIS — I10 Essential (primary) hypertension: Secondary | ICD-10-CM

## 2023-05-05 DIAGNOSIS — E559 Vitamin D deficiency, unspecified: Secondary | ICD-10-CM | POA: Diagnosis not present

## 2023-05-05 MED ORDER — AMLODIPINE-OLMESARTAN 10-40 MG PO TABS: 1.0000 | ORAL_TABLET | Freq: Every day | ORAL | 1 refills | Status: DC

## 2023-05-05 NOTE — Assessment & Plan Note (Addendum)
 BP Readings from Last 1 Encounters:  05/05/23 138/82   Uncontrolled with Losartan 100 mg QD and Amlodipine 10 mg QD Switched to Azor 10-40 mg once daily for better compliance and reducing pill burden Counseled for compliance with the medications Advised DASH diet and moderate exercise/walking, at least 150 mins/week

## 2023-05-05 NOTE — Patient Instructions (Signed)
 Please start taking Amlodipine-Olmesartan 10-40 mg once daily. Please stop taking separate Amlodipine and Losartan.  Please continue to follow DASH diet and perform moderate exercise/walking at least 150 mins/week.

## 2023-05-05 NOTE — Progress Notes (Signed)
 Established Patient Office Visit  Subjective:  Patient ID: Ryan Cain, male    DOB: 1955-02-17  Age: 68 y.o. MRN: [id number]  CC:  Chief Complaint  Patient presents with   Medical Management of Chronic Issues    Follow up    HPI Ryan Cain is a 68 y.o. male with past medical history of HTN, HLD, GAD and OA of knee who presents for f/u of his chronic medical conditions.  HTN: His BP was elevated today. Takes losartan  100 mg QD and amlodipine  10 mg QD regularly. Patient denies headache, dizziness, chest pain, dyspnea or palpitations.  GAD: Has been doing well with Cymbalta .  He had been more stressed at work, but has retired now. His dose of Cymbalta  was increased to 60 mg BID, which has improved his symptoms.  He takes evening dose of Cymbalta  only as needed now.  Denies anhedonia or SI.  HLD: His blood tests showed elevated cholesterol level. He has history of aortic atherosclerosis.  He was placed on Crestor , but had leg cramps with it. He is taking it with CoQ10 supplement now.  Past Medical History:  Diagnosis Date   Arthritis    Asthma    as a child   Hyperlipidemia    Hypertension    Situational mixed anxiety and depressive disorder    Situational mixed anxiety and depressive disorder     Past Surgical History:  Procedure Laterality Date   COLONOSCOPY WITH PROPOFOL  N/A 05/30/2021   Procedure: COLONOSCOPY WITH PROPOFOL ;  Surgeon: Vinetta Greening, DO;  Location: [facility];  Service: Endoscopy;  Laterality: N/A;  1:15 / ASA 3   DENTAL SURGERY     POLYPECTOMY  05/30/2021   Procedure: POLYPECTOMY;  Surgeon: Vinetta Greening, DO;  Location: [facility];  Service: Endoscopy;;    Family History  Problem Relation Age of Onset   Arthritis Mother    Hypertension Mother    Hyperlipidemia Mother    Dementia Mother    Heart disease Father 74       heart attack   Heart disease Paternal Uncle     Social History   Socioeconomic History   Marital status:  Married    Spouse name: Candice Chalet   Number of children: 1   Years of education: 13   Highest education level: Not on file  Occupational History   Occupation: Merchandiser, 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   Social Drivers of Corporate investment banker Strain: Not on file  Food Insecurity: Not on file  Transportation Needs: Not on file  Physical Activity: Not on file  Stress: Not on file  Social Connections: Not on file  Intimate Partner Violence: Not on file    Outpatient Medications Prior to Visit  Medication Sig Dispense Refill   acetaminophen  (TYLENOL ) 650  MG CR tablet Take 650-1,300 mg by mouth every 8 (eight) hours as needed for pain.     Cholecalciferol (VITAMIN D3) 125 MCG (5000 UT) CAPS Take 5,000 Units by mouth daily.     DULoxetine  (CYMBALTA ) 60 MG capsule TAKE (1) CAPSULE BY MOUTH (2) TIMES DAILY 60 capsule 5   fluticasone  (FLONASE ) 50 MCG/ACT nasal spray Place 2 sprays into both nostrils daily. 16 g 6   loratadine (CLARITIN) 10 MG tablet Take 10 mg by mouth daily.     Multiple Vitamins-Minerals (MULTIVITAMIN WITH MINERALS) tablet Take 1 tablet by mouth daily.     Omega-3 Fatty Acids (FISH OIL PO) Take 2,000 mg by mouth in the morning and at bedtime.     OVER THE COUNTER MEDICATION Take 2 capsules by mouth daily. Beet Root     rosuvastatin  (CRESTOR ) 5 MG tablet Take 1 tablet (5 mg total) by mouth daily. 90  tablet 3   sildenafil  (VIAGRA ) 50 MG tablet Take 1 tablet (50 mg total) by mouth daily as needed for erectile dysfunction. 30 tablet 2   amLODipine  (NORVASC ) 10 MG tablet Take 1 tablet (10 mg total) by mouth daily. 90 tablet 1   losartan  (COZAAR ) 100 MG tablet TAKE ONE TABLET BY MOUTH EVERY DAY 90 tablet 1   No facility-administered medications prior to visit.    No Known Allergies  ROS Review of Systems  Constitutional:  Negative for chills and fever.  HENT:  Negative for congestion and sore throat.   Eyes:  Negative for pain and discharge.  Respiratory:  Negative for cough and shortness of breath.   Cardiovascular:  Negative for chest pain and palpitations.  Gastrointestinal:  Negative for constipation, diarrhea, nausea and vomiting.  Endocrine: Negative for polydipsia and polyuria.  Genitourinary:  Positive for frequency. Negative for dysuria and hematuria.  Musculoskeletal:  Positive for arthralgias (B/l knee). Negative for neck pain and neck stiffness.  Skin:  Negative for rash.  Neurological:  Negative for dizziness, weakness, numbness and headaches.  Psychiatric/Behavioral:  Negative for agitation and behavioral problems.       Objective:    Physical Exam Vitals reviewed.  Constitutional:      General: He is not in acute distress.    Appearance: He is not diaphoretic.  HENT:     Head: Normocephalic and atraumatic.     Nose: No congestion.     Mouth/Throat:     Mouth: Mucous membranes are moist.     Pharynx: No oropharyngeal exudate.  Eyes:     General: No scleral icterus.    Extraocular Movements: Extraocular movements intact.  Cardiovascular:     Rate and Rhythm: Normal rate and regular rhythm.     Heart sounds: Normal heart sounds. No murmur heard. Pulmonary:     Breath sounds: Normal breath sounds. No wheezing or rales.  Musculoskeletal:     Cervical back: Neck supple. No tenderness.     Right lower leg: No edema.     Left lower leg: No edema.  Skin:     General: Skin is warm.     Findings: No rash.  Neurological:     General: No focal deficit present.     Mental Status: He is alert and oriented to person, place, and time.     Sensory: No sensory deficit.     Motor: No weakness.  Psychiatric:        Mood and Affect: Mood normal.        Behavior: Behavior normal.  BP 138/82 (BP Location: Left Arm)   Pulse 92   Ht 6' (1.829 m)   Wt 242 lb (109.8 kg)   SpO2 97%   BMI 32.82 kg/m  Wt Readings from Last 3 Encounters:  05/05/23 242 lb (109.8 kg)  01/05/23 240 lb 3.2 oz (109 kg)  09/28/22 238 lb 3.2 oz (108 kg)    Lab Results  Component Value Date   TSH 1.070 05/05/2023   Lab Results  Component Value Date   WBC 4.6 05/05/2023   HGB 12.5 (L) 05/05/2023   HCT 36.2 (L) 05/05/2023   MCV 96 05/05/2023   PLT 210 05/05/2023   Lab Results  Component Value Date   NA 141 05/05/2023   K 4.1 05/05/2023   CO2 20 05/05/2023   GLUCOSE 85 05/05/2023   BUN 15 05/05/2023   CREATININE 1.15 05/05/2023   BILITOT 0.6 05/05/2023   ALKPHOS 79 05/05/2023   AST 22 05/05/2023   ALT 17 05/05/2023   PROT 7.1 05/05/2023   ALBUMIN 4.5 05/05/2023   CALCIUM  9.8 05/05/2023   ANIONGAP 10 10/01/2020   EGFR 69 05/05/2023   Lab Results  Component Value Date   CHOL 237 (H) 05/05/2023   Lab Results  Component Value Date   HDL 79 05/05/2023   Lab Results  Component Value Date   LDLCALC 132 (H) 05/05/2023   Lab Results  Component Value Date   TRIG 151 (H) 05/05/2023   Lab Results  Component Value Date   CHOLHDL 3.0 05/05/2023   Lab Results  Component Value Date   HGBA1C 5.2 05/05/2023      Assessment & Plan:   Problem List Items Addressed This Visit       Cardiovascular and Mediastinum   Essential hypertension - Primary   BP Readings from Last 1 Encounters:  05/05/23 138/82   Uncontrolled with Losartan  100 mg QD and Amlodipine  10 mg QD Switched to Azor  10-40 mg once daily for better compliance and reducing pill  burden Counseled for compliance with the medications Advised DASH diet and moderate exercise/walking, at least 150 mins/week      Relevant Medications   amLODipine -olmesartan  (AZOR ) 10-40 MG tablet     Musculoskeletal and Integument   Osteoarthritis of both knees   Followed by orthopedic surgeon Tylenol  as needed        Other   HLD (hyperlipidemia)   Check lipid profile Added Crestor , but needs to take it regularly Advised to take CoQ10 100 mg QD for leg cramps      Relevant Medications   amLODipine -olmesartan  (AZOR ) 10-40 MG tablet   GAD (generalized anxiety disorder)   Well-controlled with Cymbalta  60 mg BID, takes evening dose only sometimes        Meds ordered this encounter  Medications   amLODipine -olmesartan  (AZOR ) 10-40 MG tablet    Sig: Take 1 tablet by mouth daily.    Dispense:  90 tablet    Refill:  1    Follow-up: Return in about 4 months (around 09/04/2023) for HTN and GAD.    Meldon Sport, MD

## 2023-05-07 LAB — HEMOGLOBIN A1C
Est. average glucose Bld gHb Est-mCnc: 103 mg/dL
Hgb A1c MFr Bld: 5.2 % (ref 4.8–5.6)

## 2023-05-07 LAB — CBC WITH DIFFERENTIAL/PLATELET
Basophils Absolute: 0 10*3/uL (ref 0.0–0.2)
Basos: 0 %
EOS (ABSOLUTE): 0.1 10*3/uL (ref 0.0–0.4)
Eos: 2 %
Hematocrit: 36.2 % — ABNORMAL LOW (ref 37.5–51.0)
Hemoglobin: 12.5 g/dL — ABNORMAL LOW (ref 13.0–17.7)
Immature Grans (Abs): 0 10*3/uL (ref 0.0–0.1)
Immature Granulocytes: 0 %
Lymphocytes Absolute: 1 10*3/uL (ref 0.7–3.1)
Lymphs: 22 %
MCH: 33.2 pg — ABNORMAL HIGH (ref 26.6–33.0)
MCHC: 34.5 g/dL (ref 31.5–35.7)
MCV: 96 fL (ref 79–97)
Monocytes Absolute: 0.5 10*3/uL (ref 0.1–0.9)
Monocytes: 10 %
Neutrophils Absolute: 2.9 10*3/uL (ref 1.4–7.0)
Neutrophils: 66 %
Platelets: 210 10*3/uL (ref 150–450)
RBC: 3.77 x10E6/uL — ABNORMAL LOW (ref 4.14–5.80)
RDW: 13.7 % (ref 11.6–15.4)
WBC: 4.6 10*3/uL (ref 3.4–10.8)

## 2023-05-07 LAB — LIPID PANEL
Chol/HDL Ratio: 3 ratio (ref 0.0–5.0)
Cholesterol, Total: 237 mg/dL — ABNORMAL HIGH (ref 100–199)
HDL: 79 mg/dL (ref >39)
LDL Chol Calc (NIH): 132 mg/dL — ABNORMAL HIGH (ref 0–99)
Triglycerides: 151 mg/dL — ABNORMAL HIGH (ref 0–149)
VLDL Cholesterol Cal: 26 mg/dL (ref 5–40)

## 2023-05-07 LAB — CMP14+EGFR
ALT: 17 IU/L (ref 0–44)
AST: 22 IU/L (ref 0–40)
Albumin: 4.5 g/dL (ref 3.9–4.9)
Alkaline Phosphatase: 79 IU/L (ref 44–121)
BUN/Creatinine Ratio: 13 (ref 10–24)
BUN: 15 mg/dL (ref 8–27)
Bilirubin Total: 0.6 mg/dL (ref 0.0–1.2)
CO2: 20 mmol/L (ref 20–29)
Calcium: 9.8 mg/dL (ref 8.6–10.2)
Chloride: 102 mmol/L (ref 96–106)
Creatinine, Ser: 1.15 mg/dL (ref 0.76–1.27)
Globulin, Total: 2.6 g/dL (ref 1.5–4.5)
Glucose: 85 mg/dL (ref 70–99)
Potassium: 4.1 mmol/L (ref 3.5–5.2)
Sodium: 141 mmol/L (ref 134–144)
Total Protein: 7.1 g/dL (ref 6.0–8.5)
eGFR: 69 mL/min/{1.73_m2} (ref >59)

## 2023-05-07 LAB — PSA: Prostate Specific Ag, Serum: 2.5 ng/mL (ref 0.0–4.0)

## 2023-05-07 LAB — VITAMIN D 25 HYDROXY (VIT D DEFICIENCY, FRACTURES): Vit D, 25-Hydroxy: 69.4 ng/mL (ref 30.0–100.0)

## 2023-05-07 LAB — TSH: TSH: 1.07 u[IU]/mL (ref 0.450–4.500)

## 2023-05-07 NOTE — Assessment & Plan Note (Signed)
Well-controlled with Cymbalta 60 mg BID, takes evening dose only sometimes

## 2023-05-07 NOTE — Assessment & Plan Note (Signed)
Followed by orthopedic surgeon Tylenol as needed

## 2023-05-07 NOTE — Assessment & Plan Note (Addendum)
 Check lipid profile Added Crestor , but needs to take it regularly Advised to take CoQ10 100 mg QD for leg cramps

## 2023-06-08 ENCOUNTER — Ambulatory Visit: Payer: PRIVATE HEALTH INSURANCE

## 2023-09-07 ENCOUNTER — Encounter: Payer: Self-pay | Admitting: Internal Medicine

## 2023-09-07 ENCOUNTER — Ambulatory Visit (INDEPENDENT_AMBULATORY_CARE_PROVIDER_SITE_OTHER): Admitting: Internal Medicine

## 2023-09-07 VITALS — BP 148/86 | HR 94 | Ht 72.0 in | Wt 249.2 lb

## 2023-09-07 DIAGNOSIS — I700 Atherosclerosis of aorta: Secondary | ICD-10-CM

## 2023-09-07 DIAGNOSIS — E782 Mixed hyperlipidemia: Secondary | ICD-10-CM

## 2023-09-07 DIAGNOSIS — R739 Hyperglycemia, unspecified: Secondary | ICD-10-CM | POA: Diagnosis not present

## 2023-09-07 DIAGNOSIS — I10 Essential (primary) hypertension: Secondary | ICD-10-CM | POA: Diagnosis not present

## 2023-09-07 DIAGNOSIS — F411 Generalized anxiety disorder: Secondary | ICD-10-CM

## 2023-09-07 DIAGNOSIS — E559 Vitamin D deficiency, unspecified: Secondary | ICD-10-CM | POA: Diagnosis not present

## 2023-09-07 DIAGNOSIS — Z125 Encounter for screening for malignant neoplasm of prostate: Secondary | ICD-10-CM

## 2023-09-07 MED ORDER — CHLORTHALIDONE 25 MG PO TABS: 12.5000 mg | ORAL_TABLET | Freq: Every day | ORAL | 1 refills | Status: AC

## 2023-09-07 NOTE — Progress Notes (Signed)
 Established Patient Office Visit  Subjective:  Patient ID: Ryan Cain, male    DOB: Aug 31, 1955  Age: 68 y.o. MRN: [id number]  CC:  Chief Complaint  Patient presents with   Hypertension    Follow up    Anxiety    Follow up     HPI Ryan Cain is a 68 y.o. male with past medical history of HTN, HLD, GAD and OA of knee who presents for f/u of his chronic medical conditions.  HTN: His BP was elevated today. Takes Azor  10-40 mg QD regularly. Patient denies headache, dizziness, chest pain, dyspnea or palpitations.  GAD: Has been doing well with Cymbalta .  He has been more stressed due to his mother and wife's health. He has retired now. His dose of Cymbalta  was increased to 60 mg BID previously, which had improved his symptoms.  He takes evening dose of Cymbalta  only as needed now.  Denies anhedonia or SI.  HLD: His blood tests showed elevated cholesterol level. He has history of aortic atherosclerosis.  He was placed on Crestor , but had leg cramps with it. He is taking it with CoQ10 supplement now.  Past Medical History:  Diagnosis Date   Arthritis    Asthma    as a child   Hyperlipidemia    Hypertension    Situational mixed anxiety and depressive disorder    Situational mixed anxiety and depressive disorder     Past Surgical History:  Procedure Laterality Date   COLONOSCOPY WITH PROPOFOL  N/A 05/30/2021   Procedure: COLONOSCOPY WITH PROPOFOL ;  Surgeon: Cindie Carlin MARLA, DO;  Location: [facility];  Service: Endoscopy;  Laterality: N/A;  1:15 / ASA 3   DENTAL SURGERY     POLYPECTOMY  05/30/2021   Procedure: POLYPECTOMY;  Surgeon: Cindie Carlin MARLA, DO;  Location: [facility];  Service: Endoscopy;;    Family History  Problem Relation Age of Onset   Arthritis Mother    Hypertension Mother    Hyperlipidemia Mother    Dementia Mother    Heart disease Father 63       heart attack   Heart disease Paternal Uncle     Social History   Socioeconomic History    Marital status: Married    Spouse name: Wanda   Number of children: 1   Years of education: 13   Highest education level: Not on file  Occupational History   Occupation: Merchandiser, 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   Social Drivers of Corporate investment banker Strain: Not on file  Food Insecurity: Not on file  Transportation Needs: Not on file  Physical Activity: Not on file  Stress: Not on file  Social Connections: Not on file  Intimate Partner Violence: Not on file    Outpatient Medications Prior to Visit  Medication Sig  Dispense Refill   acetaminophen  (TYLENOL ) 650 MG CR tablet Take 650-1,300 mg by mouth every 8 (eight) hours as needed for pain.     amLODipine -olmesartan  (AZOR ) 10-40 MG tablet Take 1 tablet by mouth daily. 90 tablet 1   Cholecalciferol (VITAMIN D3) 125 MCG (5000 UT) CAPS Take 5,000 Units by mouth daily.     DULoxetine  (CYMBALTA ) 60 MG capsule TAKE (1) CAPSULE BY MOUTH (2) TIMES DAILY 60 capsule 5   fluticasone  (FLONASE ) 50 MCG/ACT nasal spray Place 2 sprays into both nostrils daily. 16 g 6   loratadine (CLARITIN) 10 MG tablet Take 10 mg by mouth daily.     Multiple Vitamins-Minerals (MULTIVITAMIN WITH MINERALS) tablet Take 1 tablet by mouth daily.     Omega-3 Fatty Acids (FISH OIL PO) Take 2,000 mg by mouth in the morning and at bedtime.     OVER THE COUNTER MEDICATION Take 2 capsules by mouth  daily. Beet Root     rosuvastatin  (CRESTOR ) 5 MG tablet Take 1 tablet (5 mg total) by mouth daily. 90 tablet 3   sildenafil  (VIAGRA ) 50 MG tablet Take 1 tablet (50 mg total) by mouth daily as needed for erectile dysfunction. 30 tablet 2   No facility-administered medications prior to visit.    No Known Allergies  ROS Review of Systems  Constitutional:  Negative for chills and fever.  HENT:  Negative for congestion and sore throat.   Eyes:  Negative for pain and discharge.  Respiratory:  Negative for cough and shortness of breath.   Cardiovascular:  Negative for chest pain and palpitations.  Gastrointestinal:  Negative for constipation, diarrhea, nausea and vomiting.  Endocrine: Negative for polydipsia and polyuria.  Genitourinary:  Negative for dysuria and hematuria.  Musculoskeletal:  Positive for arthralgias (B/l knee). Negative for neck pain and neck stiffness.  Skin:  Negative for rash.  Neurological:  Negative for dizziness and weakness.  Psychiatric/Behavioral:  Negative for agitation and behavioral problems.       Objective:    Physical Exam Vitals reviewed.  Constitutional:      General: He is not in acute distress.    Appearance: He is not diaphoretic.  HENT:     Head: Normocephalic and atraumatic.     Nose: No congestion.     Mouth/Throat:     Mouth: Mucous membranes are moist.     Pharynx: No oropharyngeal exudate.  Eyes:     General: No scleral icterus.    Extraocular Movements: Extraocular movements intact.  Cardiovascular:     Rate and Rhythm: Normal rate and regular rhythm.     Heart sounds: Normal heart sounds. No murmur heard. Pulmonary:     Breath sounds: Normal breath sounds. No wheezing or rales.  Musculoskeletal:     Cervical back: Neck supple. No tenderness.     Right lower leg: No edema.     Left lower leg: No edema.  Skin:    General: Skin is warm.     Findings: No rash.  Neurological:     General: No focal deficit present.     Mental  Status: He is alert and oriented to person, place, and time.     Sensory: No sensory deficit.     Motor: No weakness.  Psychiatric:        Mood and Affect: Mood normal.        Behavior: Behavior normal.     BP (!) 148/86 (BP Location: Left Arm)   Pulse 94   Ht 6' (  1.829 m)   Wt 249 lb 3.2 oz (113 kg)   SpO2 97%   BMI 33.80 kg/m  Wt Readings from Last 3 Encounters:  09/07/23 249 lb 3.2 oz (113 kg)  05/05/23 242 lb (109.8 kg)  01/05/23 240 lb 3.2 oz (109 kg)    Lab Results  Component Value Date   TSH 1.070 05/05/2023   Lab Results  Component Value Date   WBC 4.6 05/05/2023   HGB 12.5 (L) 05/05/2023   HCT 36.2 (L) 05/05/2023   MCV 96 05/05/2023   PLT 210 05/05/2023   Lab Results  Component Value Date   NA 141 05/05/2023   K 4.1 05/05/2023   CO2 20 05/05/2023   GLUCOSE 85 05/05/2023   BUN 15 05/05/2023   CREATININE 1.15 05/05/2023   BILITOT 0.6 05/05/2023   ALKPHOS 79 05/05/2023   AST 22 05/05/2023   ALT 17 05/05/2023   PROT 7.1 05/05/2023   ALBUMIN 4.5 05/05/2023   CALCIUM  9.8 05/05/2023   ANIONGAP 10 10/01/2020   EGFR 69 05/05/2023   Lab Results  Component Value Date   CHOL 237 (H) 05/05/2023   Lab Results  Component Value Date   HDL 79 05/05/2023   Lab Results  Component Value Date   LDLCALC 132 (H) 05/05/2023   Lab Results  Component Value Date   TRIG 151 (H) 05/05/2023   Lab Results  Component Value Date   CHOLHDL 3.0 05/05/2023   Lab Results  Component Value Date   HGBA1C 5.2 05/05/2023      Assessment & Plan:   Problem List Items Addressed This Visit       Cardiovascular and Mediastinum   Essential hypertension - Primary   BP Readings from Last 1 Encounters:  09/07/23 (!) 148/86   Uncontrolled with Azor  10-40 mg QD Added chlorthalidone  12.5 mg QD, which can also help with leg swelling Counseled for compliance with the medications Advised DASH diet and moderate exercise/walking, at least 150 mins/week      Relevant  Medications   chlorthalidone  (HYGROTON ) 25 MG tablet   Other Relevant Orders   CMP14+EGFR   CBC with Differential/Platelet   Atherosclerosis of abdominal aorta (HCC)   Incidental finding on imaging Checked lipid profile On Crestor  5 mg QD, needs to be compliant      Relevant Medications   chlorthalidone  (HYGROTON ) 25 MG tablet     Other   HLD (hyperlipidemia)   Check lipid profile On Crestor  5 mg QD, but needs to take it regularly Advised to take CoQ10 100 mg QD for leg cramps      Relevant Medications   chlorthalidone  (HYGROTON ) 25 MG tablet   Other Relevant Orders   Lipid panel   GAD (generalized anxiety disorder)   Overall well-controlled with Cymbalta  60 mg BID, takes evening dose only sometimes Recently stressed due to his mother and sister's health      Relevant Orders   TSH   Prostate cancer screening   Ordered PSA after discussing its limitations for prostate cancer screening, including false positive results leading to additional investigations.      Relevant Orders   PSA   Other Visit Diagnoses       Hyperglycemia       Relevant Orders   Hemoglobin A1c     Vitamin D  deficiency       Relevant Orders   VITAMIN D  25 Hydroxy (Vit-D Deficiency, Fractures)         Meds ordered this encounter  Medications   chlorthalidone  (HYGROTON ) 25 MG tablet    Sig: Take 0.5 tablets (12.5 mg total) by mouth daily.    Dispense:  45 tablet    Refill:  1    Follow-up: Return in about 4 months (around 01/07/2024) for Annual physical (after 01/06/24).    Suzzane MARLA Blanch, MD

## 2023-09-07 NOTE — Patient Instructions (Addendum)
 Please start taking Chlorthalidone  half tablet as prescribed.  Please continue to take other medications as prescribed.  Please continue to follow low salt diet and perform moderate exercise/walking at least 150 mins/week.  Please get fasting blood tests done before the next visit.

## 2023-09-07 NOTE — Assessment & Plan Note (Signed)
 Ordered PSA after discussing its limitations for prostate cancer screening, including false positive results leading to additional investigations.

## 2023-09-07 NOTE — Assessment & Plan Note (Addendum)
 BP Readings from Last 1 Encounters:  09/07/23 (!) 148/86   Uncontrolled with Azor  10-40 mg QD Added chlorthalidone  12.5 mg QD, which can also help with leg swelling Counseled for compliance with the medications Advised DASH diet and moderate exercise/walking, at least 150 mins/week

## 2023-09-07 NOTE — Assessment & Plan Note (Addendum)
 Overall well-controlled with Cymbalta  60 mg BID, takes evening dose only sometimes Recently stressed due to his mother and sister's health

## 2023-09-07 NOTE — Assessment & Plan Note (Signed)
 Check lipid profile On Crestor  5 mg QD, but needs to take it regularly Advised to take CoQ10 100 mg QD for leg cramps

## 2023-09-07 NOTE — Assessment & Plan Note (Signed)
 Incidental finding on imaging Checked lipid profile On Crestor  5 mg QD, needs to be compliant

## 2023-11-13 ENCOUNTER — Other Ambulatory Visit: Payer: Self-pay | Admitting: Internal Medicine

## 2023-11-13 DIAGNOSIS — I10 Essential (primary) hypertension: Secondary | ICD-10-CM

## 2023-11-16 ENCOUNTER — Ambulatory Visit: Admitting: Internal Medicine

## 2023-11-16 ENCOUNTER — Encounter: Payer: Self-pay | Admitting: Internal Medicine

## 2023-11-16 VITALS — BP 126/67 | HR 97 | Ht 72.0 in | Wt 254.8 lb

## 2023-11-16 DIAGNOSIS — I10 Essential (primary) hypertension: Secondary | ICD-10-CM

## 2023-11-16 DIAGNOSIS — F331 Major depressive disorder, recurrent, moderate: Secondary | ICD-10-CM | POA: Diagnosis not present

## 2023-11-16 DIAGNOSIS — Z23 Encounter for immunization: Secondary | ICD-10-CM

## 2023-11-16 DIAGNOSIS — Z0000 Encounter for general adult medical examination without abnormal findings: Secondary | ICD-10-CM | POA: Diagnosis not present

## 2023-11-16 NOTE — Assessment & Plan Note (Signed)
 BP Readings from Last 1 Encounters:  11/16/23 126/67   Well-controlled with Azor  10-40 mg QD and chlorthalidone  12.5 mg QD now, which can also help with leg swelling Counseled for compliance with the medications Advised DASH diet and moderate exercise/walking, at least 150 mins/week

## 2023-11-16 NOTE — Patient Instructions (Signed)
 Please continue to take medications as prescribed.  Please continue to follow low carb diet and perform moderate exercise/walking at least 150 mins/week.  Please get fasting blood tests done before the next visit.

## 2023-11-16 NOTE — Progress Notes (Unsigned)
 Subjective:    Ryan Cain is a 68 y.o. male who presents for a Welcome to Medicare exam.   Cardiac Risk Factors include: none     Objective:    Today's Vitals   11/16/23 1451  BP: 126/67  Pulse: 97  SpO2: 97%  Weight: 254 lb 12.8 oz (115.6 kg)  Height: 6' (1.829 m)  PainSc: 0-No pain   Body mass index is 34.56 kg/m.  Medications Outpatient Encounter Medications as of 11/16/2023  Medication Sig   acetaminophen  (TYLENOL ) 650 MG CR tablet Take 650-1,300 mg by mouth every 8 (eight) hours as needed for pain.   amLODipine -olmesartan  (AZOR ) 10-40 MG tablet TAKE ONE TABLET BY MOUTH EVERY DAY   chlorthalidone  (HYGROTON ) 25 MG tablet Take 0.5 tablets (12.5 mg total) by mouth daily.   Cholecalciferol (VITAMIN D3) 125 MCG (5000 UT) CAPS Take 5,000 Units by mouth daily.   DULoxetine  (CYMBALTA ) 60 MG capsule TAKE (1) CAPSULE BY MOUTH (2) TIMES DAILY   fluticasone  (FLONASE ) 50 MCG/ACT nasal spray Place 2 sprays into both nostrils daily.   loratadine (CLARITIN) 10 MG tablet Take 10 mg by mouth daily.   Multiple Vitamins-Minerals (MULTIVITAMIN WITH MINERALS) tablet Take 1 tablet by mouth daily.   Omega-3 Fatty Acids (FISH OIL PO) Take 2,000 mg by mouth in the morning and at bedtime.   OVER THE COUNTER MEDICATION Take 2 capsules by mouth daily. Beet Root   rosuvastatin  (CRESTOR ) 5 MG tablet Take 1 tablet (5 mg total) by mouth daily.   sildenafil  (VIAGRA ) 50 MG tablet Take 1 tablet (50 mg total) by mouth daily as needed for erectile dysfunction.   No facility-administered encounter medications on file as of 11/16/2023.     History: Past Medical History:  Diagnosis Date   Arthritis    Asthma    as a child   Hyperlipidemia    Hypertension    Situational mixed anxiety and depressive disorder    Situational mixed anxiety and depressive disorder    Past Surgical History:  Procedure Laterality Date   COLONOSCOPY WITH PROPOFOL  N/A 05/30/2021   Procedure: COLONOSCOPY WITH PROPOFOL ;   Surgeon: Cindie Carlin MARLA, DO;  [facility];  Service: Endoscopy;  Laterality: N/A;  1:15 / ASA 3   DENTAL SURGERY     POLYPECTOMY  05/30/2021   Procedure: POLYPECTOMY;  Surgeon: Cindie Carlin MARLA, DO;  Location: [facility];  Service: Endoscopy;;    Family History  Problem Relation Age of Onset   Arthritis Mother    Hypertension Mother    Hyperlipidemia Mother    Dementia Mother    Heart disease Father 36       heart attack   Heart disease Paternal Uncle    Social History   Occupational History   Occupation: merchandiser, 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  Tobacco Use   Smoking status: Never   Smokeless tobacco: Never  Vaping Use   Vaping status: Never Used  Substance and Sexual Activity   Alcohol use: Yes    Alcohol/week: 6.0 standard drinks of alcohol    Types: 6 Shots of liquor per week    Comment: 3x a week bourbon   Drug use: No   Sexual activity: Yes    Birth control/protection: Post-menopausal    Tobacco Counseling Counseling given: Yes   Immunizations and Health Maintenance Immunization History  Administered Date(s) Administered   DTaP 01/20/2007   Fluad Quad(high Dose 65+) 11/27/2020   Fluad Trivalent(High Dose 65+) 09/28/2022  INFLUENZA, HIGH DOSE SEASONAL PF 11/16/2023   Influenza,inj,Quad PF,6+ Mos 11/05/2015, 11/14/2019   Moderna Sars-Covid-2 Vaccination 05/08/2019, 06/05/2019   PNEUMOCOCCAL CONJUGATE-20 01/10/2021   Tdap 11/05/2015   Zoster Recombinant(Shingrix ) 09/28/2022, 01/05/2023   Health Maintenance Due  Topic Date Due   COVID-19 Vaccine (5 - 2025-26 season) 09/20/2023    Activities of Daily Living    11/16/2023    3:01 PM  In your present state of health, do you have any difficulty performing the following activities:  Hearing? 0  Vision? 0  Difficulty concentrating or making decisions? 0  Walking or climbing stairs? 0  Dressing or bathing? 0  Doing errands, shopping? 0  Preparing Food and eating ? N  Using the Toilet?  N  In the past six months, have you accidently leaked urine? Y  Comment occasionally  Do you have problems with loss of bowel control? N  Managing your Medications? N  Managing your Finances? N  Housekeeping or managing your Housekeeping? N    Physical Exam   Physical Exam (optional), or other factors deemed appropriate based on the beneficiary's medical and social history and current clinical standards.   Advanced Directives: Does Patient Have a Medical Advance Directive?: No Would patient like information on creating a medical advance directive?: Yes (Inpatient - patient defers creating a medical advance directive at this time - Information given)   EKG:  {ekg findings:315101}     Assessment:    This is a routine wellness  examination for this patient . ***  Vision/Hearing screen No results found.   Goals      Blood Pressure < 140/90     Blood Pressure < 140/90     Keep blood pressure down and eat healthy. Will be checking bp and watch his food intake.      Exercise 150 minutes per week (moderate activity)     Reduce alcohol intake to 1-2 servings per day     Weight (lb) < 200 lb (90.7 kg)         Depression Screen    11/16/2023    3:05 PM 09/07/2023    3:25 PM 05/05/2023    3:23 PM 09/28/2022    4:08 PM  PHQ 2/9 Scores  PHQ - 2 Score 0 0 0 4  PHQ- 9 Score  0 0 11     Fall Risk    11/16/2023    3:07 PM  Fall Risk   Falls in the past year? 1  Number falls in past yr: 0  Injury with Fall? 1  Risk for fall due to : Other (Comment)  Follow up Falls evaluation completed    Cognitive Function        11/16/2023    3:09 PM 11/16/2023    3:08 PM  6CIT Screen  What Year?  0 points  What month?  0 points  What time? 0 points 0 points  Count back from 20 0 points 0 points  Months in reverse 0 points   Repeat phrase 0 points     Patient Care Team: Tobie Suzzane POUR, MD as PCP - General (Internal Medicine) Shaaron, Lamar HERO, MD as Consulting Physician  (Gastroenterology)     Plan:   ***  I have personally reviewed and noted the following in the patient's chart:   Medical and social history Use of alcohol, tobacco or illicit drugs  Current medications and supplements including opioid prescriptions. {Opioid Prescriptions:[id number]} Functional ability and status Nutritional status Physical activity Advanced directives  List of other physicians Hospitalizations, surgeries, and ER visits in previous 12 months Vitals Screenings to include cognitive, depression, and falls Referrals and appointments  In addition, I have reviewed and discussed with patient certain preventive protocols, quality metrics, and best practice recommendations. A written personalized care plan for preventive services as well as general preventive health recommendations were provided to patient.     Suzzane MARLA Blanch, MD 11/16/2023

## 2023-11-17 NOTE — Assessment & Plan Note (Addendum)
 Flowsheet Row Office Visit from 09/07/2023 in [facility]  PHQ-9 Total Score 0   Well-controlled On Cymbalta  60 mg QD

## 2023-11-17 NOTE — Assessment & Plan Note (Addendum)
 Screening questionnaire reviewed. Discussed about age-appropriate screenings and vaccinations.

## 2024-01-07 LAB — CBC WITH DIFFERENTIAL/PLATELET
Basophils Absolute: 0.1 x10E3/uL (ref 0.0–0.2)
Basos: 1 %
EOS (ABSOLUTE): 0.2 x10E3/uL (ref 0.0–0.4)
Eos: 3 %
Hematocrit: 35.1 % — ABNORMAL LOW (ref 37.5–51.0)
Hemoglobin: 11.7 g/dL — ABNORMAL LOW (ref 13.0–17.7)
Immature Grans (Abs): 0 x10E3/uL (ref 0.0–0.1)
Immature Granulocytes: 0 %
Lymphocytes Absolute: 1.1 x10E3/uL (ref 0.7–3.1)
Lymphs: 22 %
MCH: 33.1 pg — ABNORMAL HIGH (ref 26.6–33.0)
MCHC: 33.3 g/dL (ref 31.5–35.7)
MCV: 99 fL — ABNORMAL HIGH (ref 79–97)
Monocytes Absolute: 0.5 x10E3/uL (ref 0.1–0.9)
Monocytes: 10 %
Neutrophils Absolute: 3.1 x10E3/uL (ref 1.4–7.0)
Neutrophils: 63 %
Platelets: 211 x10E3/uL (ref 150–450)
RBC: 3.53 x10E6/uL — ABNORMAL LOW (ref 4.14–5.80)
RDW: 14 % (ref 11.6–15.4)
WBC: 5 x10E3/uL (ref 3.4–10.8)

## 2024-01-07 LAB — PSA: Prostate Specific Ag, Serum: 3.4 ng/mL (ref 0.0–4.0)

## 2024-01-07 LAB — LIPID PANEL
Chol/HDL Ratio: 2.6 ratio (ref 0.0–5.0)
Cholesterol, Total: 164 mg/dL (ref 100–199)
HDL: 64 mg/dL
LDL Chol Calc (NIH): 88 mg/dL (ref 0–99)
Triglycerides: 59 mg/dL (ref 0–149)
VLDL Cholesterol Cal: 12 mg/dL (ref 5–40)

## 2024-01-07 LAB — CMP14+EGFR
ALT: 44 IU/L (ref 0–44)
AST: 40 IU/L (ref 0–40)
Albumin: 4.4 g/dL (ref 3.9–4.9)
Alkaline Phosphatase: 71 IU/L (ref 47–123)
BUN/Creatinine Ratio: 10 (ref 10–24)
BUN: 15 mg/dL (ref 8–27)
Bilirubin Total: 0.6 mg/dL (ref 0.0–1.2)
CO2: 17 mmol/L — ABNORMAL LOW (ref 20–29)
Calcium: 9.9 mg/dL (ref 8.6–10.2)
Chloride: 107 mmol/L — ABNORMAL HIGH (ref 96–106)
Creatinine, Ser: 1.44 mg/dL — ABNORMAL HIGH (ref 0.76–1.27)
Globulin, Total: 2.1 g/dL (ref 1.5–4.5)
Glucose: 80 mg/dL (ref 70–99)
Potassium: 4.2 mmol/L (ref 3.5–5.2)
Sodium: 142 mmol/L (ref 134–144)
Total Protein: 6.5 g/dL (ref 6.0–8.5)
eGFR: 53 mL/min/1.73 — ABNORMAL LOW

## 2024-01-07 LAB — HEMOGLOBIN A1C
Est. average glucose Bld gHb Est-mCnc: 114 mg/dL
Hgb A1c MFr Bld: 5.6 % (ref 4.8–5.6)

## 2024-01-07 LAB — VITAMIN D 25 HYDROXY (VIT D DEFICIENCY, FRACTURES): Vit D, 25-Hydroxy: 68.1 ng/mL (ref 30.0–100.0)

## 2024-01-07 LAB — TSH: TSH: 1.18 u[IU]/mL (ref 0.450–4.500)

## 2024-01-10 ENCOUNTER — Ambulatory Visit: Payer: Self-pay | Admitting: Internal Medicine

## 2024-01-10 ENCOUNTER — Encounter: Payer: Self-pay | Admitting: Internal Medicine

## 2024-01-10 VITALS — BP 134/78 | HR 89 | Ht 72.0 in | Wt 256.0 lb

## 2024-01-10 DIAGNOSIS — F411 Generalized anxiety disorder: Secondary | ICD-10-CM

## 2024-01-10 DIAGNOSIS — Z0001 Encounter for general adult medical examination with abnormal findings: Secondary | ICD-10-CM | POA: Diagnosis not present

## 2024-01-10 DIAGNOSIS — I10 Essential (primary) hypertension: Secondary | ICD-10-CM | POA: Diagnosis not present

## 2024-01-10 DIAGNOSIS — E782 Mixed hyperlipidemia: Secondary | ICD-10-CM

## 2024-01-10 DIAGNOSIS — D649 Anemia, unspecified: Secondary | ICD-10-CM | POA: Insufficient documentation

## 2024-01-10 DIAGNOSIS — M170 Bilateral primary osteoarthritis of knee: Secondary | ICD-10-CM

## 2024-01-10 MED ORDER — DULOXETINE HCL 60 MG PO CPEP: 60.0000 mg | ORAL_CAPSULE | Freq: Two times a day (BID) | ORAL | 5 refills | Status: AC

## 2024-01-10 MED ORDER — ROSUVASTATIN CALCIUM 5 MG PO TABS: 5.0000 mg | ORAL_TABLET | Freq: Every day | ORAL | 3 refills | Status: AC

## 2024-01-10 NOTE — Assessment & Plan Note (Addendum)
 BP Readings from Last 1 Encounters:  01/10/24 134/78   Well-controlled with Azor  10-40 mg QD and chlorthalidone  12.5 mg QD now, which can also help with leg swelling Advised to take chlorthalidone  12.5 mg QOD for now due to recent worsening of renal function Counseled for compliance with the medications Advised DASH diet and moderate exercise/walking, at least 150 mins/week

## 2024-01-10 NOTE — Progress Notes (Signed)
 "  Established Patient Office Visit  Subjective:  Patient ID: Ryan Cain, male    DOB: 1955/10/16  Age: 68 y.o. MRN: [id number]  CC:  Chief Complaint  Patient presents with   Annual Exam    HPI Ryan Cain is a 68 y.o. male with past medical history of HTN, HLD, GAD and OA of knee who presents for f/u of his chronic medical conditions.  HTN: His BP was wnl today. Takes Azor  10-40 mg QD and Chlorthalidone  12.5 mg QD regularly. Patient denies headache, dizziness, chest pain, dyspnea or palpitations.  GAD: Has been doing well with Cymbalta .  He has been more stressed due to his mother and wife's health. He has retired now. His dose of Cymbalta  was increased to 60 mg BID previously, which had improved his symptoms. Denies anhedonia or SI.  HLD: His blood tests showed improved cholesterol level. He has history of aortic atherosclerosis.  He takes Crestor  5 mg QD regularly, had leg cramps with it initially. He is taking it with CoQ10 supplement now.  He reports chronic right knee pain with swelling on the medial side.  Reports recent worsening of knee pain, and has to use cane at times as he feels his legs would give out.  He has been evaluated by orthopedic surgery in 2022 for left knee OA.  He prefers to take Tylenol  arthritis for pain.   Past Medical History:  Diagnosis Date   Anemia 01/10/2024   Arthritis    Asthma    as a child   Hyperlipidemia    Hypertension    Situational mixed anxiety and depressive disorder    Situational mixed anxiety and depressive disorder     Past Surgical History:  Procedure Laterality Date   COLONOSCOPY WITH PROPOFOL  N/A 05/30/2021   Procedure: COLONOSCOPY WITH PROPOFOL ;  Surgeon: Cindie Carlin MARLA, DO;  [facility];  Service: Endoscopy;  Laterality: N/A;  1:15 / ASA 3   DENTAL SURGERY     POLYPECTOMY  05/30/2021   Procedure: POLYPECTOMY;  Surgeon: Cindie Carlin MARLA, DO;  [facility];  Service: Endoscopy;;    Family  History  Problem Relation Age of Onset   Arthritis Mother    Hypertension Mother    Hyperlipidemia Mother    Dementia Mother    Heart disease Father 22       heart attack   Heart disease Paternal Uncle     Social History   Socioeconomic History   Marital status: Married    Spouse name: Ryan Cain   Number of children: 1   Years of education: 13   Highest education level: Not on file  Occupational History   Occupation: merchandiser, 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   Social Drivers of Health   Tobacco Use: Low Risk (01/10/2024)   Patient  History    Smoking Tobacco Use: Never    Smokeless Tobacco Use: Never    Passive Exposure: Not on file  Financial Resource Strain: Patient Declined (11/16/2023)   Overall Financial Resource Strain (CARDIA)    Difficulty of Paying Living Expenses: Patient declined  Food Insecurity: No Food Insecurity (11/16/2023)   Epic    Worried About Programme Researcher, Broadcasting/film/video in the Last Year: Never true    Ran Out of Food in the Last Year: Never true  Transportation Needs: No Transportation Needs (11/16/2023)   Epic    Lack of Transportation (Medical): No    Lack of Transportation (Non-Medical): No  Physical Activity: Inactive (11/16/2023)   Exercise Vital Sign    Days of Exercise per Week: 0 days    Minutes of Exercise per Session: 0 min  Stress: No Stress Concern Present (11/16/2023)   [employer] - Occupational Stress Questionnaire    Feeling of Stress: Not at all  Social Connections: Moderately Isolated (11/16/2023)   Social Connection and Isolation Panel    Frequency of Communication with Friends and Family: Three times a week    Frequency of Social Gatherings with Friends and Family: Three times a week    Attends Religious Services: Never    Active Member of Clubs or Organizations: No    Attends Banker Meetings: Never    Marital Status: Married  Catering Manager Violence: Not At Risk (11/16/2023)   Epic    Fear of Current or Ex-Partner: No    Emotionally Abused: No    Physically Abused: No    Sexually Abused: No  Depression (PHQ2-9): Low Risk (01/10/2024)   Depression (PHQ2-9)    PHQ-2 Score: 2  Alcohol Screen: Medium Risk (11/16/2023)   Alcohol Screen    Last Alcohol Screening Score (AUDIT): 9  Housing: Unknown (11/16/2023)   Epic    Unable to Pay for Housing in the Last Year: No    Number of Times Moved in the Last Year: Not on file    Homeless in the Last Year: No  Utilities: Not At Risk (11/16/2023)   Epic    Threatened with loss of utilities: No  Health Literacy: Adequate Health Literacy (11/16/2023)   B1300 Health Literacy    Frequency of need for help with medical instructions: Never    Outpatient Medications Prior to Visit  Medication Sig Dispense Refill   acetaminophen  (TYLENOL ) 650 MG CR tablet Take 650-1,300 mg by mouth every 8 (eight) hours as needed for pain.     amLODipine -olmesartan  (AZOR ) 10-40 MG tablet TAKE ONE TABLET BY MOUTH EVERY DAY 90 tablet 1   chlorthalidone  (HYGROTON ) 25 MG tablet Take 0.5 tablets (12.5 mg total) by mouth daily. 45 tablet 1   Cholecalciferol (VITAMIN D3) 125 MCG (5000 UT) CAPS Take 5,000 Units by mouth daily.     fluticasone  (FLONASE ) 50 MCG/ACT nasal spray Place 2 sprays into both nostrils daily. 16 g 6   loratadine (CLARITIN) 10 MG tablet Take 10 mg by mouth daily.      Multiple Vitamins-Minerals (MULTIVITAMIN WITH MINERALS) tablet Take 1 tablet by mouth daily.     Omega-3 Fatty Acids (FISH OIL PO) Take 2,000 mg by mouth in the morning and at bedtime.     OVER THE COUNTER MEDICATION Take 2 capsules by mouth daily. Beet Root     sildenafil  (VIAGRA ) 50 MG tablet Take 1 tablet (50 mg total) by mouth daily as needed for erectile dysfunction. 30  tablet 2   DULoxetine  (CYMBALTA ) 60 MG capsule TAKE (1) CAPSULE BY MOUTH (2) TIMES DAILY 60 capsule 5   rosuvastatin  (CRESTOR ) 5 MG tablet Take 1 tablet (5 mg total) by mouth daily. 90 tablet 3   No facility-administered medications prior to visit.    No Known Allergies  ROS Review of Systems  Constitutional:  Negative for chills and fever.  HENT:  Negative for congestion and sore throat.   Eyes:  Negative for pain and discharge.  Respiratory:  Negative for cough and shortness of breath.   Cardiovascular:  Negative for chest pain and palpitations.  Gastrointestinal:  Negative for constipation, diarrhea, nausea and vomiting.  Endocrine: Negative for polydipsia and polyuria.  Genitourinary:  Negative for dysuria and hematuria.  Musculoskeletal:  Positive for arthralgias (B/l knee). Negative for neck pain and neck stiffness.  Skin:  Negative for rash.  Neurological:  Negative for dizziness and weakness.  Psychiatric/Behavioral:  Negative for agitation and behavioral problems.       Objective:    Physical Exam Vitals reviewed.  Constitutional:      General: He is not in acute distress.    Appearance: He is not diaphoretic.  HENT:     Head: Normocephalic and atraumatic.     Nose: No congestion.     Mouth/Throat:     Mouth: Mucous membranes are moist.     Pharynx: No oropharyngeal exudate.  Eyes:     General: No scleral icterus.    Extraocular Movements: Extraocular movements intact.  Cardiovascular:     Rate and Rhythm: Normal rate and regular rhythm.     Heart sounds: Normal heart sounds. No murmur  heard. Pulmonary:     Breath sounds: Normal breath sounds. No wheezing or rales.  Abdominal:     Palpations: Abdomen is soft.     Tenderness: There is no abdominal tenderness.  Musculoskeletal:     Cervical back: Neck supple. No tenderness.     Right knee: Erythema present. Tenderness present over the medial joint line.     Right lower leg: No edema.     Left lower leg: No edema.  Skin:    General: Skin is warm.     Findings: No rash.  Neurological:     General: No focal deficit present.     Mental Status: He is alert and oriented to person, place, and time.     Sensory: No sensory deficit.     Motor: No weakness.  Psychiatric:        Mood and Affect: Mood normal.        Behavior: Behavior normal.     BP 134/78   Pulse 89   Ht 6' (1.829 m)   Wt 256 lb (116.1 kg)   SpO2 94%   BMI 34.72 kg/m  Wt Readings from Last 3 Encounters:  01/10/24 256 lb (116.1 kg)  11/16/23 254 lb 12.8 oz (115.6 kg)  09/07/23 249 lb 3.2 oz (113 kg)    Lab Results  Component Value Date   TSH 1.180 01/06/2024   Lab Results  Component Value Date   WBC 5.0 01/06/2024   HGB 11.7 (L) 01/06/2024   HCT 35.1 (L) 01/06/2024   MCV 99 (H) 01/06/2024   PLT 211 01/06/2024   Lab Results  Component Value Date   NA 142 01/06/2024   K 4.2 01/06/2024   CO2 17 (L) 01/06/2024   GLUCOSE 80 01/06/2024   BUN 15 01/06/2024   CREATININE 1.44 (H) 01/06/2024  BILITOT 0.6 01/06/2024   ALKPHOS 71 01/06/2024   AST 40 01/06/2024   ALT 44 01/06/2024   PROT 6.5 01/06/2024   ALBUMIN 4.4 01/06/2024   CALCIUM  9.9 01/06/2024   ANIONGAP 10 10/01/2020   EGFR 53 (L) 01/06/2024   Lab Results  Component Value Date   CHOL 164 01/06/2024   Lab Results  Component Value Date   HDL 64 01/06/2024   Lab Results  Component Value Date   LDLCALC 88 01/06/2024   Lab Results  Component Value Date   TRIG 59 01/06/2024   Lab Results  Component Value Date   CHOLHDL 2.6 01/06/2024   Lab Results  Component Value  Date   HGBA1C 5.6 01/06/2024      Assessment & Plan:   Problem List Items Addressed This Visit       Cardiovascular and Mediastinum   Essential hypertension   BP Readings from Last 1 Encounters:  01/10/24 134/78   Well-controlled with Azor  10-40 mg QD and chlorthalidone  12.5 mg QD now, which can also help with leg swelling Advised to take chlorthalidone  12.5 mg QOD for now due to recent worsening of renal function Counseled for compliance with the medications Advised DASH diet and moderate exercise/walking, at least 150 mins/week      Relevant Medications   rosuvastatin  (CRESTOR ) 5 MG tablet   Other Relevant Orders   Basic Metabolic Panel (BMET)     Musculoskeletal and Integument   Osteoarthritis of both knees   Check x-ray of right knee due to chronic knee swelling and recent worsening of pain Has been evaluated by orthopedic surgeon previously Tylenol  as needed      Relevant Orders   DG Knee Complete 4 Views Right     Other   HLD (hyperlipidemia)   Checked lipid profile - LDL improved now On Crestor  5 mg QD, but needs to continue to take it regularly Advised to take CoQ10 100 mg QD for leg cramps      Relevant Medications   rosuvastatin  (CRESTOR ) 5 MG tablet   GAD (generalized anxiety disorder)   Overall well-controlled with Cymbalta  60 mg BID, takes BID regularly now Recently stressed due to his mother and wife's health      Relevant Medications   DULoxetine  (CYMBALTA ) 60 MG capsule   Encounter for general adult medical examination with abnormal findings - Primary   Physical exam as documented. Counseling done  re healthy lifestyle involving commitment to 150 minutes exercise per week, heart healthy diet, and attaining healthy weight.The importance of adequate sleep also discussed. Immunization and cancer screening needs are specifically addressed at this visit.      Anemia   Hb: 11.7, used to be ~12.5 Denies any recent signs of bleeding Recheck CBC,  will also obtain iron profile in the next blood test Advised to start taking iron supplement - ferrous sulfate 325 mg QD for now       Relevant Orders   CBC with Differential/Platelet   Fe+TIBC+Fer      Meds ordered this encounter  Medications   DULoxetine  (CYMBALTA ) 60 MG capsule    Sig: Take 1 capsule (60 mg total) by mouth 2 (two) times daily.    Dispense:  60 capsule    Refill:  5   rosuvastatin  (CRESTOR ) 5 MG tablet    Sig: Take 1 tablet (5 mg total) by mouth daily.    Dispense:  90 tablet    Refill:  3    Follow-up: Return in about  4 months (around 05/10/2024) for HTN and GAD.    Suzzane MARLA Blanch, MD "

## 2024-01-10 NOTE — Patient Instructions (Addendum)
 Please start taking iron supplement - ferrous sulfate 325 mg once daily.  Please start taking Chlorthalidone  every other day instead once daily.  Please get X-ray of right knee done at [facility].  Please continue to take other medications as prescribed.  Please continue to follow low salt diet and perform moderate exercise/walking at least 150 mins/week.  Please get fasting blood tests done before the next visit.

## 2024-01-10 NOTE — Assessment & Plan Note (Signed)
 Check x-ray of right knee due to chronic knee swelling and recent worsening of pain Has been evaluated by orthopedic surgeon previously Tylenol  as needed

## 2024-01-10 NOTE — Assessment & Plan Note (Signed)
 Physical exam as documented. Counseling done  re healthy lifestyle involving commitment to 150 minutes exercise per week, heart healthy diet, and attaining healthy weight.The importance of adequate sleep also discussed. Immunization and cancer screening needs are specifically addressed at this visit.

## 2024-01-10 NOTE — Assessment & Plan Note (Addendum)
 Hb: 11.7, used to be ~12.5 Denies any recent signs of bleeding Recheck CBC, will also obtain iron profile in the next blood test Advised to start taking iron supplement - ferrous sulfate 325 mg QD for now

## 2024-01-10 NOTE — Assessment & Plan Note (Addendum)
 Overall well-controlled with Cymbalta  60 mg BID, takes BID regularly now Recently stressed due to his mother and wife's health

## 2024-01-10 NOTE — Assessment & Plan Note (Addendum)
 Checked lipid profile - LDL improved now On Crestor  5 mg QD, but needs to continue to take it regularly Advised to take CoQ10 100 mg QD for leg cramps

## 2024-05-15 ENCOUNTER — Ambulatory Visit: Payer: Self-pay | Admitting: Internal Medicine
